# Patient Record
Sex: Female | Born: 1985
Health system: Southern US, Community
[De-identification: ages and names within clinical notes are randomized; demographics above are authoritative.]

## PROBLEM LIST (undated history)

## (undated) DIAGNOSIS — Z87442 Personal history of urinary calculi: Secondary | ICD-10-CM

## (undated) DIAGNOSIS — F419 Anxiety disorder, unspecified: Secondary | ICD-10-CM

## (undated) DIAGNOSIS — R519 Headache, unspecified: Secondary | ICD-10-CM

## (undated) DIAGNOSIS — J45909 Unspecified asthma, uncomplicated: Secondary | ICD-10-CM

## (undated) HISTORY — PX: NO PAST SURGERIES: SHX2092

---

## 2015-09-22 DIAGNOSIS — M533 Sacrococcygeal disorders, not elsewhere classified: Secondary | ICD-10-CM | POA: Diagnosis not present

## 2015-11-18 DIAGNOSIS — N760 Acute vaginitis: Secondary | ICD-10-CM | POA: Diagnosis not present

## 2015-11-18 DIAGNOSIS — F419 Anxiety disorder, unspecified: Secondary | ICD-10-CM | POA: Diagnosis not present

## 2015-12-09 DIAGNOSIS — H6121 Impacted cerumen, right ear: Secondary | ICD-10-CM | POA: Diagnosis not present

## 2015-12-11 DIAGNOSIS — H6121 Impacted cerumen, right ear: Secondary | ICD-10-CM | POA: Diagnosis not present

## 2017-02-16 DIAGNOSIS — Z23 Encounter for immunization: Secondary | ICD-10-CM | POA: Diagnosis not present

## 2017-03-20 DIAGNOSIS — Z6824 Body mass index (BMI) 24.0-24.9, adult: Secondary | ICD-10-CM | POA: Diagnosis not present

## 2017-03-20 DIAGNOSIS — N390 Urinary tract infection, site not specified: Secondary | ICD-10-CM | POA: Diagnosis not present

## 2017-04-18 DIAGNOSIS — Z0000 Encounter for general adult medical examination without abnormal findings: Secondary | ICD-10-CM | POA: Diagnosis not present

## 2017-04-21 DIAGNOSIS — R300 Dysuria: Secondary | ICD-10-CM | POA: Diagnosis not present

## 2017-04-21 DIAGNOSIS — Z0000 Encounter for general adult medical examination without abnormal findings: Secondary | ICD-10-CM | POA: Diagnosis not present

## 2017-06-05 DIAGNOSIS — M545 Low back pain: Secondary | ICD-10-CM | POA: Diagnosis not present

## 2017-06-05 DIAGNOSIS — R300 Dysuria: Secondary | ICD-10-CM | POA: Diagnosis not present

## 2017-12-04 ENCOUNTER — Other Ambulatory Visit (HOSPITAL_COMMUNITY): Payer: Self-pay | Admitting: Adult Health Nurse Practitioner

## 2017-12-04 ENCOUNTER — Ambulatory Visit (HOSPITAL_COMMUNITY)
Admission: RE | Admit: 2017-12-04 | Discharge: 2017-12-04 | Disposition: A | Discharge status: Home / Self Care | Payer: BLUE CROSS/BLUE SHIELD | Source: Ambulatory Visit | Attending: Adult Health Nurse Practitioner | Admitting: Adult Health Nurse Practitioner

## 2017-12-04 DIAGNOSIS — M79672 Pain in left foot: Secondary | ICD-10-CM | POA: Diagnosis not present

## 2017-12-04 DIAGNOSIS — Z6824 Body mass index (BMI) 24.0-24.9, adult: Secondary | ICD-10-CM | POA: Diagnosis not present

## 2017-12-04 DIAGNOSIS — M25572 Pain in left ankle and joints of left foot: Secondary | ICD-10-CM | POA: Diagnosis not present

## 2018-01-01 DIAGNOSIS — Z6824 Body mass index (BMI) 24.0-24.9, adult: Secondary | ICD-10-CM | POA: Diagnosis not present

## 2018-01-01 DIAGNOSIS — J060 Acute laryngopharyngitis: Secondary | ICD-10-CM | POA: Diagnosis not present

## 2018-01-19 DIAGNOSIS — Z6824 Body mass index (BMI) 24.0-24.9, adult: Secondary | ICD-10-CM | POA: Diagnosis not present

## 2018-01-19 DIAGNOSIS — R319 Hematuria, unspecified: Secondary | ICD-10-CM | POA: Diagnosis not present

## 2018-01-19 DIAGNOSIS — R300 Dysuria: Secondary | ICD-10-CM | POA: Diagnosis not present

## 2018-08-08 DIAGNOSIS — R532 Functional quadriplegia: Secondary | ICD-10-CM | POA: Diagnosis not present

## 2018-08-08 DIAGNOSIS — Z6824 Body mass index (BMI) 24.0-24.9, adult: Secondary | ICD-10-CM | POA: Diagnosis not present

## 2018-08-08 DIAGNOSIS — E785 Hyperlipidemia, unspecified: Secondary | ICD-10-CM | POA: Diagnosis not present

## 2018-08-13 DIAGNOSIS — E663 Overweight: Secondary | ICD-10-CM | POA: Diagnosis not present

## 2018-08-13 DIAGNOSIS — E785 Hyperlipidemia, unspecified: Secondary | ICD-10-CM | POA: Diagnosis not present

## 2018-08-13 DIAGNOSIS — Z0000 Encounter for general adult medical examination without abnormal findings: Secondary | ICD-10-CM | POA: Diagnosis not present

## 2018-08-13 DIAGNOSIS — F419 Anxiety disorder, unspecified: Secondary | ICD-10-CM | POA: Diagnosis not present

## 2018-08-13 DIAGNOSIS — E782 Mixed hyperlipidemia: Secondary | ICD-10-CM | POA: Diagnosis not present

## 2018-08-13 DIAGNOSIS — F411 Generalized anxiety disorder: Secondary | ICD-10-CM | POA: Diagnosis not present

## 2018-08-13 DIAGNOSIS — R300 Dysuria: Secondary | ICD-10-CM | POA: Diagnosis not present

## 2019-01-30 DIAGNOSIS — R358 Other polyuria: Secondary | ICD-10-CM | POA: Diagnosis not present

## 2019-01-30 DIAGNOSIS — R300 Dysuria: Secondary | ICD-10-CM | POA: Diagnosis not present

## 2019-02-18 DIAGNOSIS — M79672 Pain in left foot: Secondary | ICD-10-CM | POA: Diagnosis not present

## 2019-02-18 DIAGNOSIS — D473 Essential (hemorrhagic) thrombocythemia: Secondary | ICD-10-CM | POA: Diagnosis not present

## 2019-02-18 DIAGNOSIS — Z0000 Encounter for general adult medical examination without abnormal findings: Secondary | ICD-10-CM | POA: Diagnosis not present

## 2019-02-18 DIAGNOSIS — Z6824 Body mass index (BMI) 24.0-24.9, adult: Secondary | ICD-10-CM | POA: Diagnosis not present

## 2019-02-18 DIAGNOSIS — R300 Dysuria: Secondary | ICD-10-CM | POA: Diagnosis not present

## 2019-02-18 DIAGNOSIS — R87619 Unspecified abnormal cytological findings in specimens from cervix uteri: Secondary | ICD-10-CM | POA: Diagnosis not present

## 2019-02-18 DIAGNOSIS — F419 Anxiety disorder, unspecified: Secondary | ICD-10-CM | POA: Diagnosis not present

## 2019-02-18 DIAGNOSIS — E785 Hyperlipidemia, unspecified: Secondary | ICD-10-CM | POA: Diagnosis not present

## 2019-02-18 DIAGNOSIS — R358 Other polyuria: Secondary | ICD-10-CM | POA: Diagnosis not present

## 2019-02-18 DIAGNOSIS — Z130 Encounter for screening for diseases of the blood and blood-forming organs and certain disorders involving the immune mechanism: Secondary | ICD-10-CM | POA: Diagnosis not present

## 2019-02-18 DIAGNOSIS — M545 Low back pain: Secondary | ICD-10-CM | POA: Diagnosis not present

## 2019-02-18 DIAGNOSIS — R600 Localized edema: Secondary | ICD-10-CM | POA: Diagnosis not present

## 2019-04-05 ENCOUNTER — Other Ambulatory Visit: Payer: Self-pay

## 2019-04-05 DIAGNOSIS — Z20822 Contact with and (suspected) exposure to covid-19: Secondary | ICD-10-CM

## 2019-04-07 LAB — NOVEL CORONAVIRUS, NAA: SARS-CoV-2, NAA: NOT DETECTED

## 2019-04-08 ENCOUNTER — Telehealth: Payer: Self-pay | Admitting: *Deleted

## 2019-04-08 NOTE — Telephone Encounter (Signed)
Pt given result of COVID test; she verbalized understanding. 

## 2019-04-18 DIAGNOSIS — R0981 Nasal congestion: Secondary | ICD-10-CM | POA: Diagnosis not present

## 2019-04-18 DIAGNOSIS — R358 Other polyuria: Secondary | ICD-10-CM | POA: Diagnosis not present

## 2019-04-18 DIAGNOSIS — R509 Fever, unspecified: Secondary | ICD-10-CM | POA: Diagnosis not present

## 2019-04-18 DIAGNOSIS — J069 Acute upper respiratory infection, unspecified: Secondary | ICD-10-CM | POA: Diagnosis not present

## 2019-04-18 DIAGNOSIS — E785 Hyperlipidemia, unspecified: Secondary | ICD-10-CM | POA: Diagnosis not present

## 2019-04-18 DIAGNOSIS — R05 Cough: Secondary | ICD-10-CM | POA: Diagnosis not present

## 2019-04-18 DIAGNOSIS — F419 Anxiety disorder, unspecified: Secondary | ICD-10-CM | POA: Diagnosis not present

## 2019-04-18 DIAGNOSIS — Z0000 Encounter for general adult medical examination without abnormal findings: Secondary | ICD-10-CM | POA: Diagnosis not present

## 2019-06-03 DIAGNOSIS — R109 Unspecified abdominal pain: Secondary | ICD-10-CM | POA: Diagnosis not present

## 2019-06-03 DIAGNOSIS — R945 Abnormal results of liver function studies: Secondary | ICD-10-CM | POA: Diagnosis not present

## 2019-06-03 DIAGNOSIS — E785 Hyperlipidemia, unspecified: Secondary | ICD-10-CM | POA: Diagnosis not present

## 2019-06-03 DIAGNOSIS — F419 Anxiety disorder, unspecified: Secondary | ICD-10-CM | POA: Diagnosis not present

## 2019-06-03 DIAGNOSIS — R197 Diarrhea, unspecified: Secondary | ICD-10-CM | POA: Diagnosis not present

## 2019-06-03 DIAGNOSIS — R1010 Upper abdominal pain, unspecified: Secondary | ICD-10-CM | POA: Diagnosis not present

## 2019-06-13 IMAGING — DX DG ANKLE COMPLETE 3+V*L*
3 series · 3 of 3 positions shown · non-contrast
Comparison: None.

CLINICAL DATA: Shooting pain.  No known injury.

EXAM:
LEFT ANKLE COMPLETE - 3+ VIEW

[ankle ap]
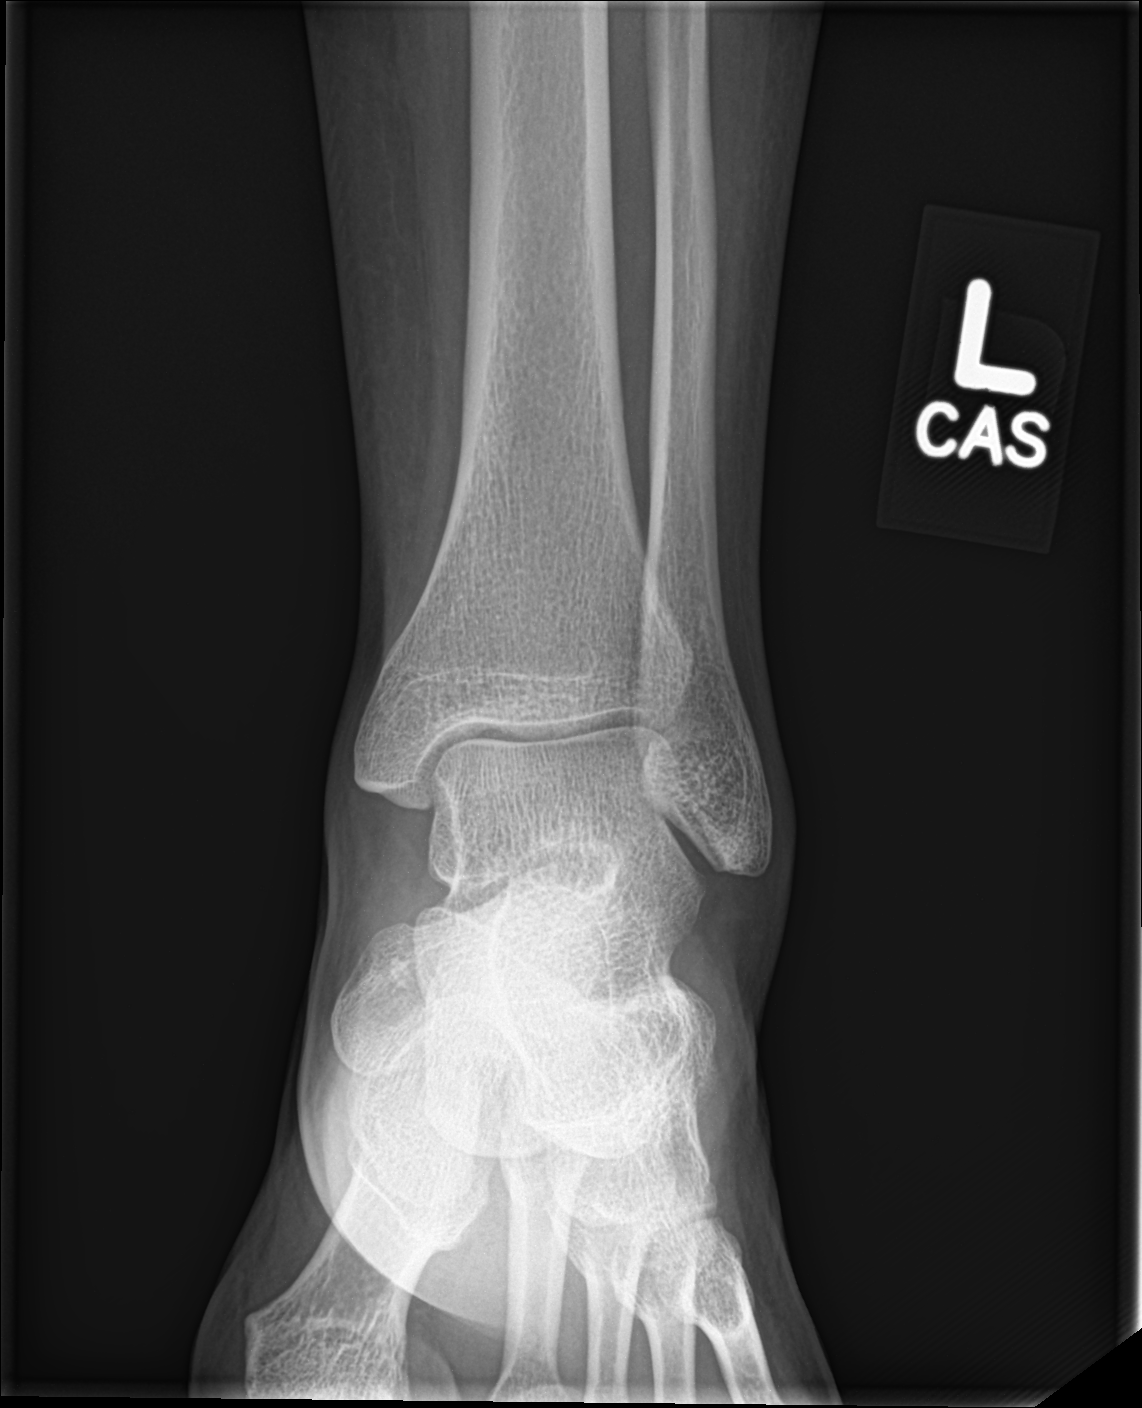

[ankle obl]
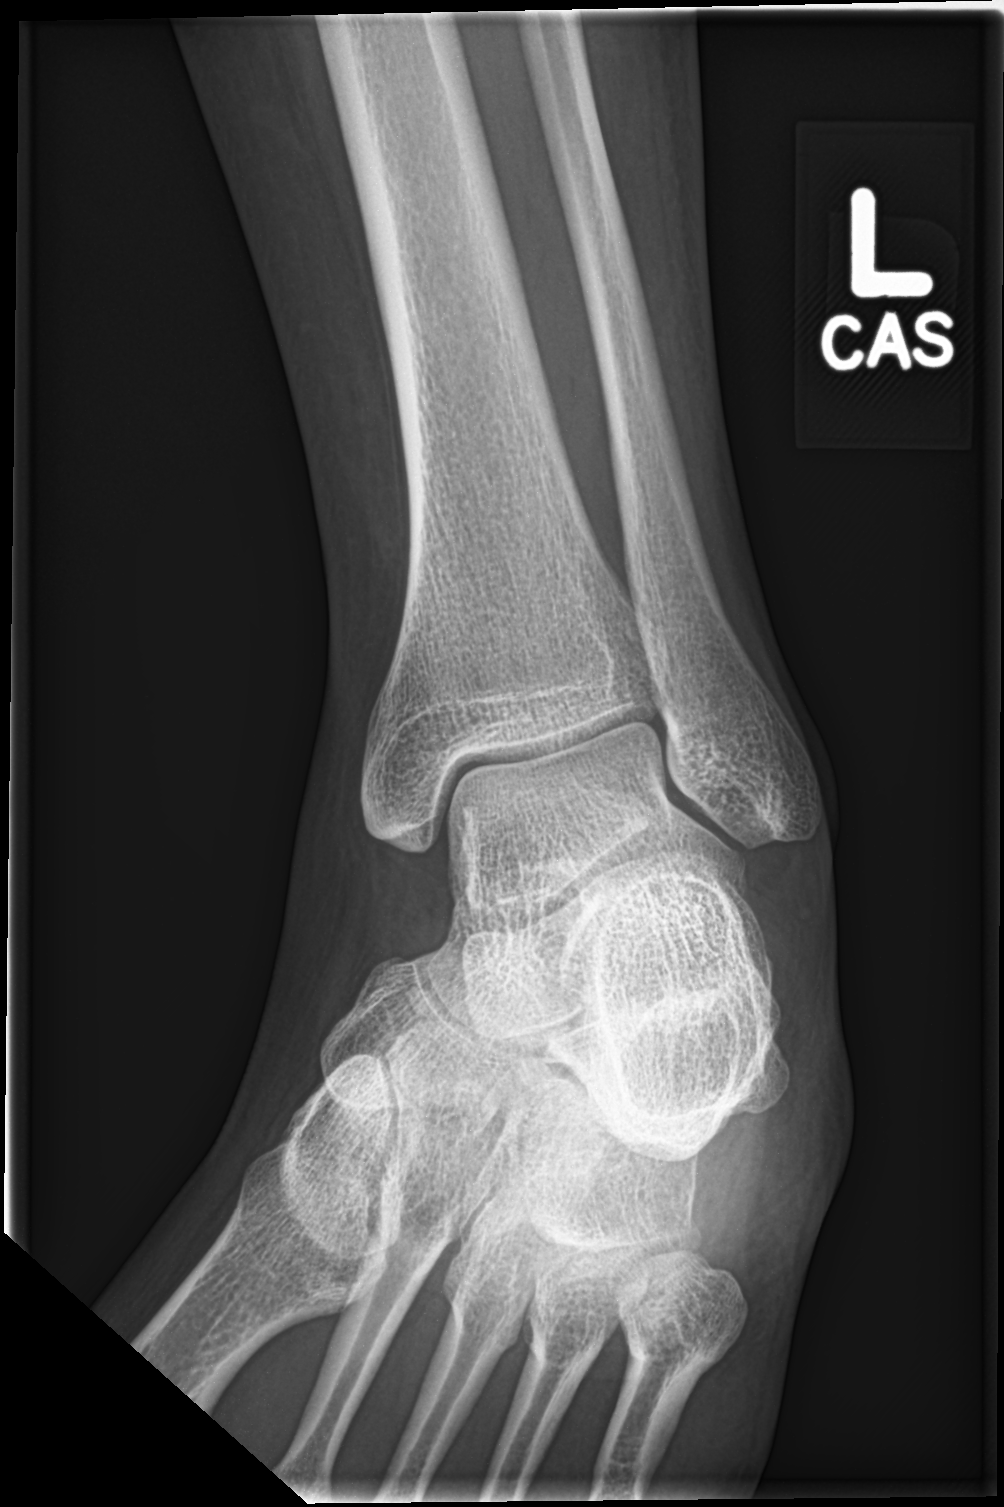

[ankle lat]
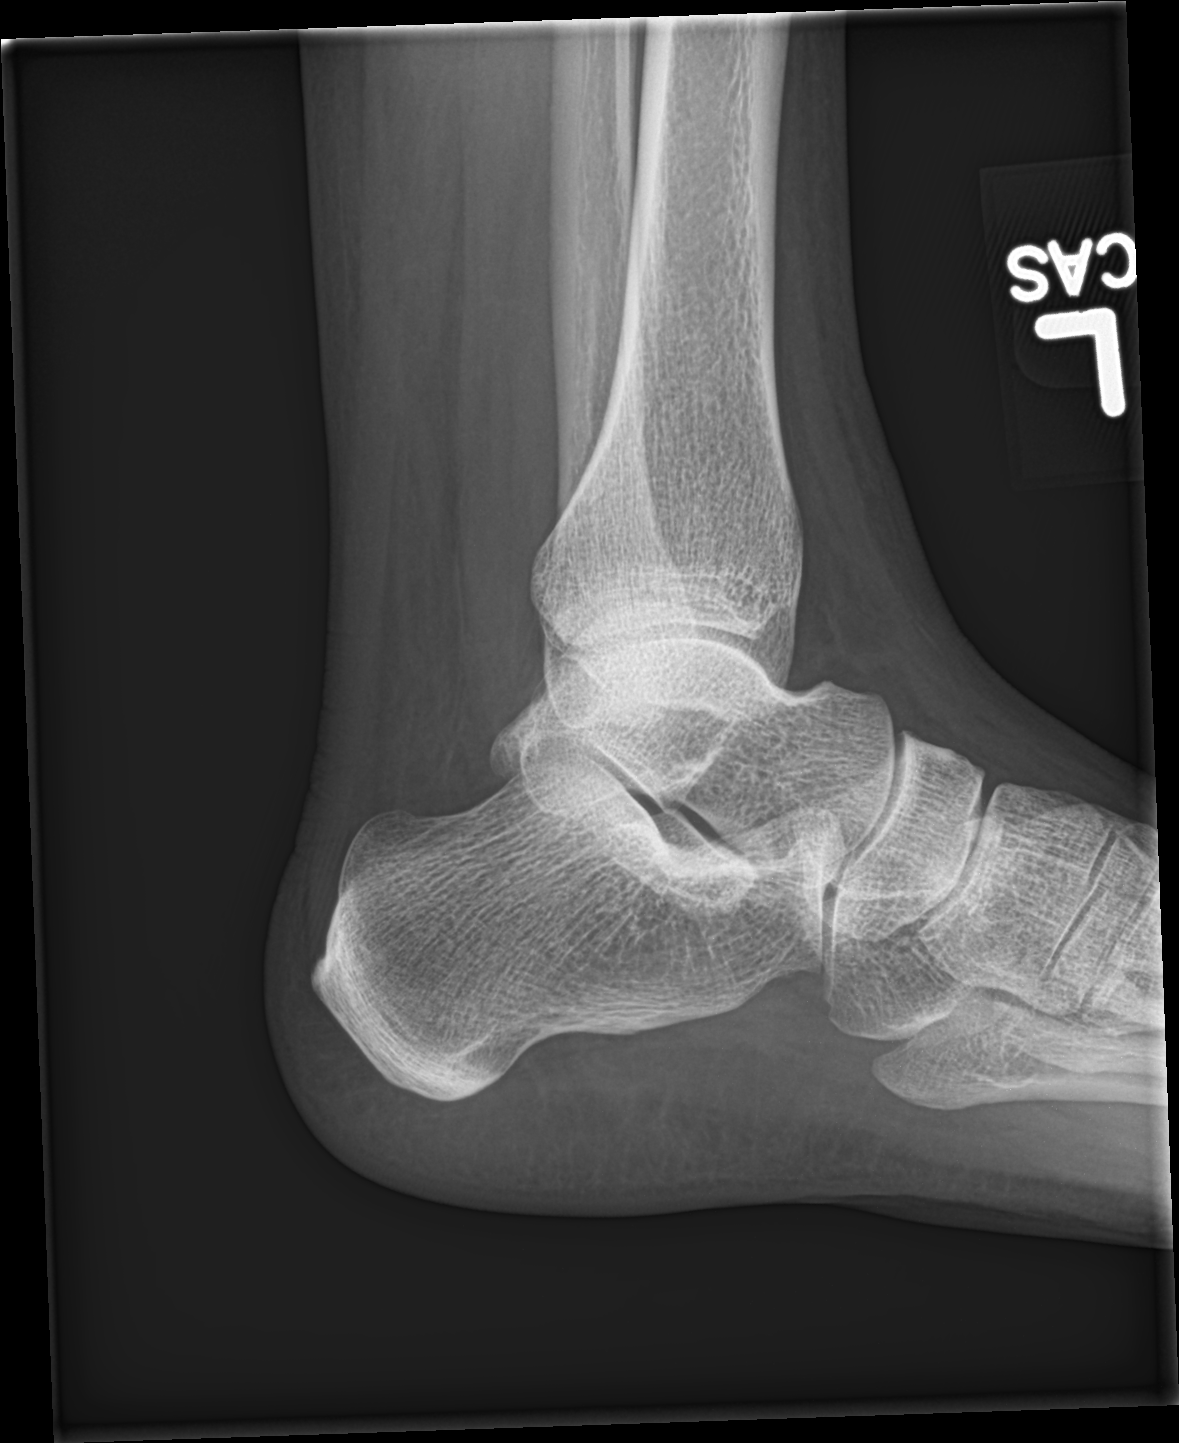

[3 of 3 positions shown; findings below may reference images not displayed]

FINDINGS: There is no evidence of fracture, dislocation, or joint effusion.
There is no evidence of arthropathy or other focal bone abnormality.
Soft tissues are unremarkable.
IMPRESSION: Negative.

## 2019-06-13 IMAGING — DX DG FOOT COMPLETE 3+V*L*
3 series · 3 of 3 positions shown · non-contrast
Comparison: None.

CLINICAL DATA: Shooting pain.  No known injury.

EXAM:
LEFT FOOT - COMPLETE 3+ VIEW

[foot ap]
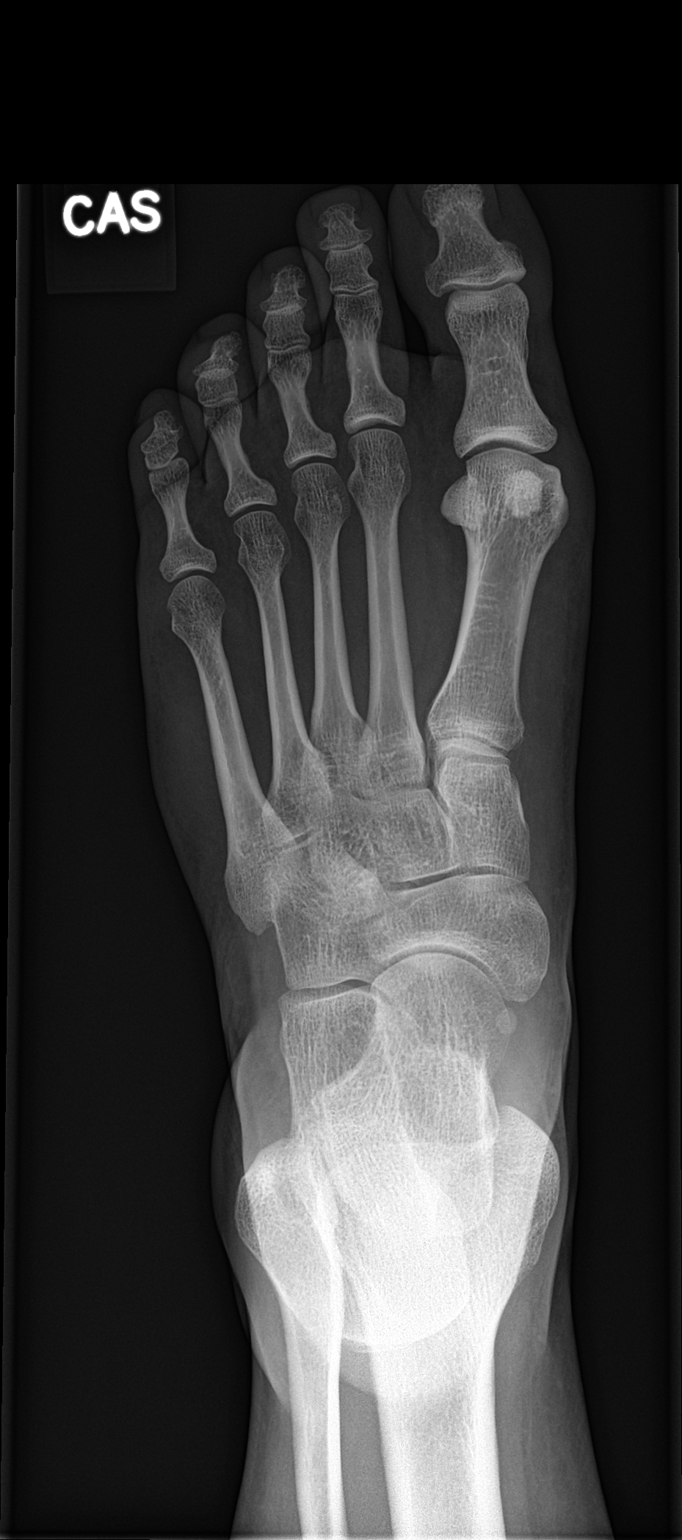

[foot obl]
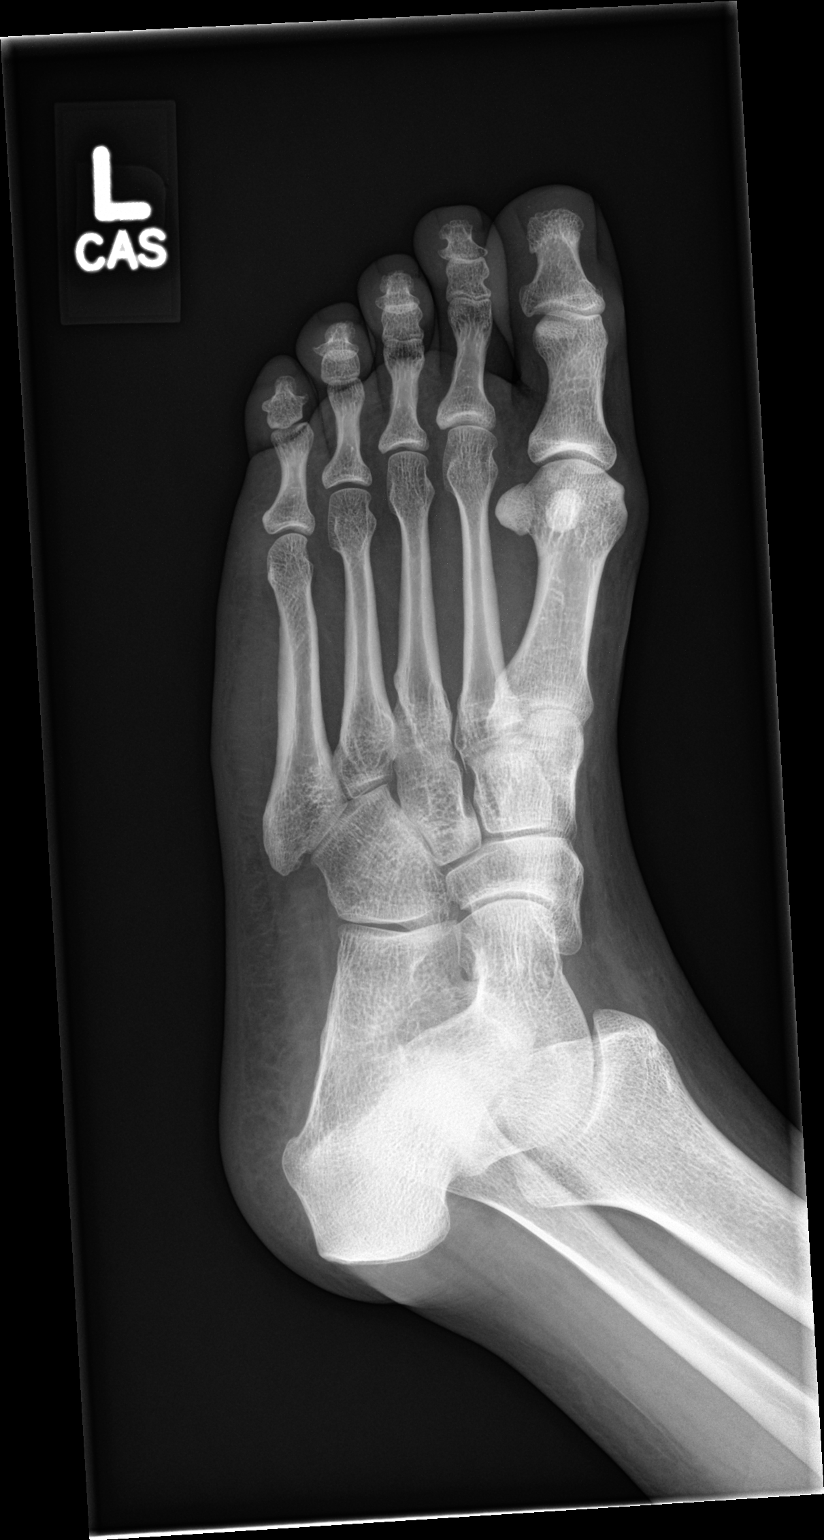

[foot lat]
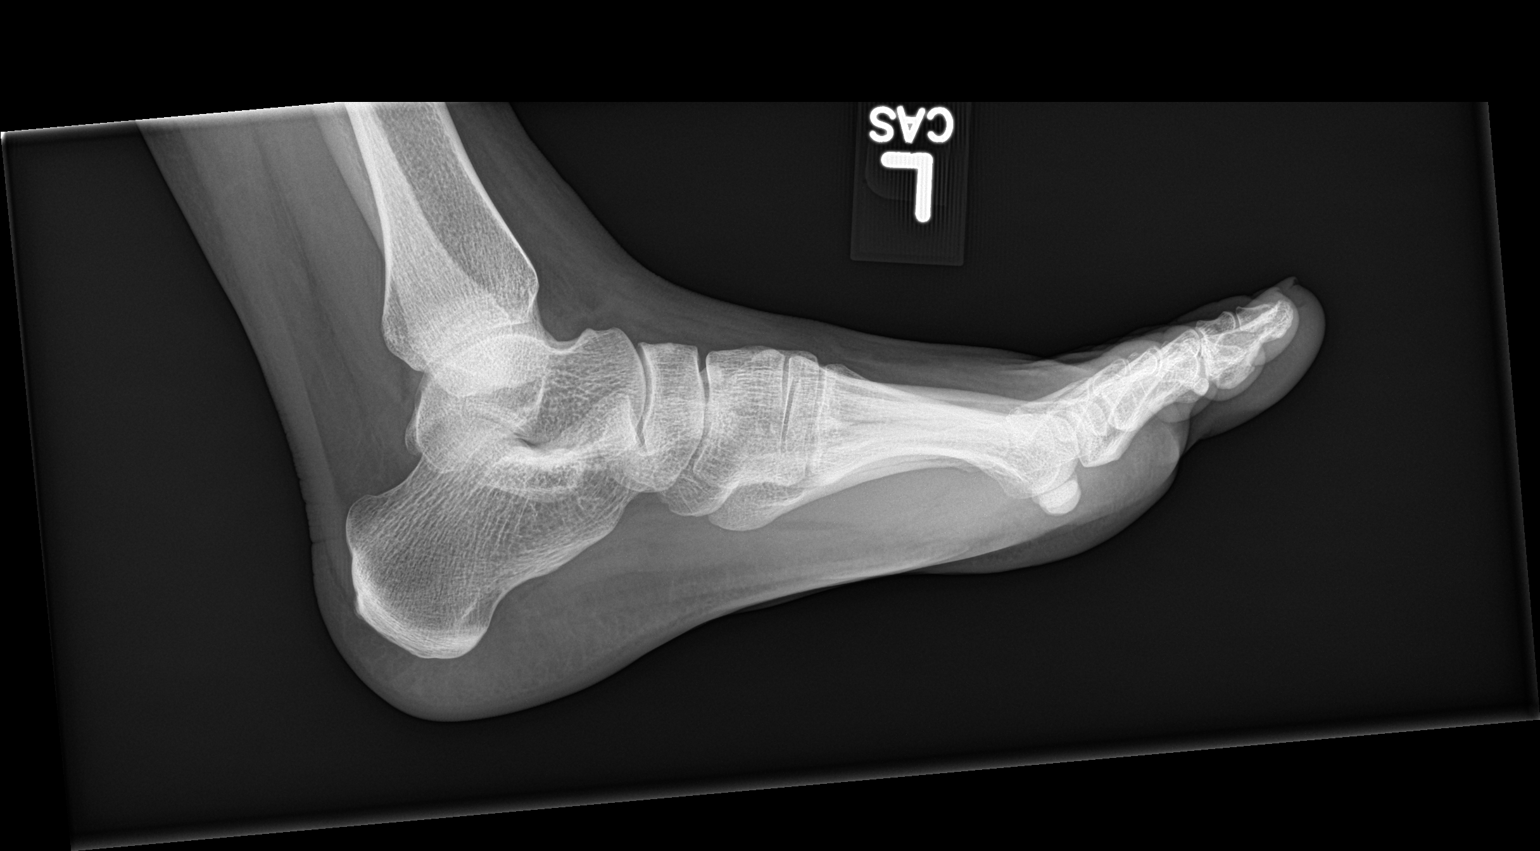

[3 of 3 positions shown; findings below may reference images not displayed]

FINDINGS: There is no evidence of fracture or dislocation. There is no
evidence of arthropathy or other focal bone abnormality. Soft
tissues are unremarkable.
IMPRESSION: Negative.

## 2020-01-22 DIAGNOSIS — R10811 Right upper quadrant abdominal tenderness: Secondary | ICD-10-CM | POA: Diagnosis not present

## 2020-01-27 ENCOUNTER — Other Ambulatory Visit: Payer: Self-pay | Admitting: Internal Medicine

## 2020-01-27 ENCOUNTER — Other Ambulatory Visit (HOSPITAL_COMMUNITY): Payer: Self-pay | Admitting: Internal Medicine

## 2020-01-27 DIAGNOSIS — R10811 Right upper quadrant abdominal tenderness: Secondary | ICD-10-CM

## 2020-02-03 ENCOUNTER — Other Ambulatory Visit: Payer: Self-pay

## 2020-02-03 ENCOUNTER — Ambulatory Visit (HOSPITAL_COMMUNITY)
Admission: RE | Admit: 2020-02-03 | Discharge: 2020-02-03 | Disposition: A | Discharge status: Home / Self Care | Payer: BLUE CROSS/BLUE SHIELD | Source: Ambulatory Visit | Attending: Internal Medicine | Admitting: Internal Medicine

## 2020-02-03 DIAGNOSIS — R10811 Right upper quadrant abdominal tenderness: Secondary | ICD-10-CM | POA: Diagnosis not present

## 2020-02-03 DIAGNOSIS — K8020 Calculus of gallbladder without cholecystitis without obstruction: Secondary | ICD-10-CM | POA: Diagnosis not present

## 2020-02-03 DIAGNOSIS — K760 Fatty (change of) liver, not elsewhere classified: Secondary | ICD-10-CM | POA: Diagnosis not present

## 2020-03-25 DIAGNOSIS — K811 Chronic cholecystitis: Secondary | ICD-10-CM | POA: Diagnosis not present

## 2020-04-01 NOTE — Progress Notes (Signed)
DUE TO COVID-19 ONLY ONE VISITOR IS ALLOWED TO COME WITH YOU AND STAY IN THE WAITING ROOM ONLY DURING PRE OP AND PROCEDURE DAY OF SURGERY. THE 1 VISITOR  MAY VISIT WITH YOU AFTER SURGERY IN YOUR PRIVATE ROOM DURING VISITING HOURS ONLY!  YOU NEED TO HAVE A COVID 19 TEST ON_11/07/2019 ______ @_______ , THIS TEST MUST BE DONE BEFORE SURGERY,  COVID TESTING SITE [address] [place] , IT IS ON THE RIGHT GOING OUT [address] APPROXIMATELY  2 MINUTES PAST [employer] ON THE RIGHT. ONCE YOUR COVID TEST IS COMPLETED,  PLEASE BEGIN THE QUARANTINE INSTRUCTIONS AS OUTLINED IN YOUR HANDOUT.                Lindsay Andrade  04/01/2020   Your procedure is scheduled on: 04/10/2020    Report to [facility] Main  Entrance   Report to admitting      0830am     Call this number if you have problems the morning of surgery [phone]    Remember: Do not eat food , candy gum or mints :After Midnight. You may have clear liquids from midnight until 0730am    CLEAR LIQUID DIET   Foods Allowed                                                                       Coffee and tea, regular and decaf                              Plain Jell-O any favor except red or purple                                            Fruit ices (not with fruit pulp)                                      Iced Popsicles                                     Carbonated beverages, regular and diet                                    Cranberry, grape and apple juices Sports drinks like Gatorade Lightly seasoned clear broth or consume(fat free) Sugar, honey syrup   _____________________________________________________________________    BRUSH YOUR TEETH MORNING OF SURGERY AND RINSE YOUR MOUTH OUT, NO CHEWING GUM CANDY OR MINTS.     Take these medicines the morning of surgery with A SIP OF WATER:    Zoloft                                You may not have any metal on your body including hair  pins and  piercings  Do not wear jewelry, make-up, lotions, powders or perfumes, deodorant             Do not wear nail polish on your fingernails.  Do not shave  48 hours prior to surgery.              Men may shave face and neck.   Do not bring valuables to the hospital. [place].  Contacts, dentures or bridgework may not be worn into surgery.  Leave suitcase in the car. After surgery it may be brought to your room.     Patients discharged the day of surgery will not be allowed to drive home. IF YOU ARE HAVING SURGERY AND GOING HOME THE SAME DAY, YOU MUST HAVE AN ADULT TO DRIVE YOU HOME AND BE WITH YOU FOR 24 HOURS. YOU MAY GO HOME BY TAXI OR UBER OR ORTHERWISE, BUT AN ADULT MUST ACCOMPANY YOU HOME AND STAY WITH YOU FOR 24 HOURS.  Name and phone number of your driver:  Special Instructions: N/A              Please read over the following fact sheets you were given: _____________________________________________________________________  [facility] - Preparing for Surgery Before surgery, you can play an important role.  Because skin is not sterile, your skin needs to be as free of germs as possible.  You can reduce the number of germs on your skin by washing with CHG (chlorahexidine gluconate) soap before surgery.  CHG is an antiseptic cleaner which kills germs and bonds with the skin to continue killing germs even after washing. Please DO NOT use if you have an allergy to CHG or antibacterial soaps.  If your skin becomes reddened/irritated stop using the CHG and inform your nurse when you arrive at Short Stay. Do not shave (including legs and underarms) for at least 48 hours prior to the first CHG shower.  You may shave your face/neck. Please follow these instructions carefully:  1.  Shower with CHG Soap the night before surgery and the  morning of Surgery.  2.  If you choose to wash your hair, wash your hair first as usual with  your  normal  shampoo.  3.  After you shampoo, rinse your hair and body thoroughly to remove the  shampoo.                           4.  Use CHG as you would any other liquid soap.  You can apply chg directly  to the skin and wash                       Gently with a scrungie or clean washcloth.  5.  Apply the CHG Soap to your body ONLY FROM THE NECK DOWN.   Do not use on face/ open                           Wound or open sores. Avoid contact with eyes, ears mouth and genitals (private parts).                       Wash face,  Genitals (private parts) with your normal soap.             6.  Wash thoroughly, paying special attention to the area where your surgery  will be performed.  7.  Thoroughly rinse your body with warm water from the neck down.  8.  DO NOT shower/wash with your normal soap after using and rinsing off  the CHG Soap.                9.  Pat yourself dry with a clean towel.            10.  Wear clean pajamas.            11.  Place clean sheets on your bed the night of your first shower and do not  sleep with pets. Day of Surgery : Do not apply any lotions/deodorants the morning of surgery.  Please wear clean clothes to the hospital/surgery center.  FAILURE TO FOLLOW THESE INSTRUCTIONS MAY RESULT IN THE CANCELLATION OF YOUR SURGERY PATIENT SIGNATURE_________________________________  NURSE SIGNATURE__________________________________  ________________________________________________________________________

## 2020-04-02 ENCOUNTER — Encounter (HOSPITAL_COMMUNITY): Payer: Self-pay

## 2020-04-02 ENCOUNTER — Other Ambulatory Visit: Payer: Self-pay

## 2020-04-02 ENCOUNTER — Encounter (HOSPITAL_COMMUNITY)
Admission: RE | Admit: 2020-04-02 | Discharge: 2020-04-02 | Disposition: A | Discharge status: Home / Self Care | Payer: BLUE CROSS/BLUE SHIELD | Source: Ambulatory Visit | Attending: Surgery | Admitting: Surgery

## 2020-04-02 DIAGNOSIS — Z01812 Encounter for preprocedural laboratory examination: Secondary | ICD-10-CM | POA: Insufficient documentation

## 2020-04-02 HISTORY — DX: Headache, unspecified: R51.9

## 2020-04-02 HISTORY — DX: Unspecified asthma, uncomplicated: J45.909

## 2020-04-02 HISTORY — DX: Anxiety disorder, unspecified: F41.9

## 2020-04-02 LAB — CBC
HCT: 36.1 % (ref 36.0–46.0)
Hemoglobin: 12.5 g/dL (ref 12.0–15.0)
MCH: 31.6 pg (ref 26.0–34.0)
MCHC: 34.6 g/dL (ref 30.0–36.0)
MCV: 91.4 fL (ref 80.0–100.0)
Platelets: 524 10*3/uL — ABNORMAL HIGH (ref 150–400)
RBC: 3.95 MIL/uL (ref 3.87–5.11)
RDW: 11.9 % (ref 11.5–15.5)
WBC: 8.7 10*3/uL (ref 4.0–10.5)
nRBC: 0 % (ref 0.0–0.2)

## 2020-04-06 ENCOUNTER — Other Ambulatory Visit (HOSPITAL_COMMUNITY): Payer: BLUE CROSS/BLUE SHIELD

## 2020-04-07 ENCOUNTER — Other Ambulatory Visit (HOSPITAL_COMMUNITY)
Admission: RE | Admit: 2020-04-07 | Discharge: 2020-04-07 | Disposition: A | Discharge status: Home / Self Care | Payer: BLUE CROSS/BLUE SHIELD | Source: Ambulatory Visit | Attending: Surgery | Admitting: Surgery

## 2020-04-07 DIAGNOSIS — Z01812 Encounter for preprocedural laboratory examination: Secondary | ICD-10-CM | POA: Diagnosis not present

## 2020-04-07 DIAGNOSIS — Z20822 Contact with and (suspected) exposure to covid-19: Secondary | ICD-10-CM | POA: Diagnosis not present

## 2020-04-07 LAB — SARS CORONAVIRUS 2 (TAT 6-24 HRS): SARS Coronavirus 2: NEGATIVE

## 2020-04-09 NOTE — H&P (Signed)
Lindsay Andrade Appointment: 03/25/2020 9:30 AM Location: [place] [facility] Patient #: [id number] DOB: 1986-06-01 Married / Language: Undefined / Race: Refused to Report/Unreported Female   History of Present Illness Lindsay Hazard B. Daphine Deutscher MD; 03/25/2020 10:04 AM) The patient is a 34 year old female who presents for evaluation of gall stones. this 34 year old white female was referred from her primary with gallstones and a history of frequent nocturnal attacks of pain the last hour to 2 hours radiating to her back and shoulder. She had a significant one when [place] was at the beach. Her ultrasound showed multiple gallstones but no evidence of acute cholecystitis.  She comes with her mother and we discussed laparoscopic cholecystectomy vitamin C in detail using the booklet. I described for complications not limited to common bile duct injury, bleeding bile leaks or bowel injury.  She is never had any abdominal surgery and has had 2 children vaginally. She's been otherwise very healthy.   Past Surgical History Lindsay Andrade, CMA; 03/25/2020 9:27 AM) No pertinent past surgical history   Diagnostic Studies History Lindsay Andrade, CMA; 03/25/2020 9:27 AM) Colonoscopy  never Mammogram  never Pap Smear  1-5 years ago  Allergies Lindsay Andrade, CMA; 03/25/2020 9:29 AM) No Known Drug Allergies  [03/25/2020]:  Medication History Lindsay Andrade, CMA; 03/25/2020 9:29 AM) No Current Medications Medications Reconciled  Social History Lindsay Andrade, CMA; 03/25/2020 9:27 AM) Caffeine use  Carbonated beverages. No alcohol use  No drug use  Tobacco use  Current some day smoker.  Family History Lindsay Andrade, CMA; 03/25/2020 9:27 AM) Alcohol Abuse  Father. Breast Cancer  Family Members In General. Cervical Cancer  Family Members In General. Colon Polyps  Family Members In General, Mother. Depression  Sister. Diabetes Mellitus  Family Members In General, Mother. Hypertension  Family  Members In General, Father. Kidney Disease  Family Members In General. Migraine Headache  Father. Ovarian Cancer  Family Members In General. Thyroid problems  Family Members In General.  Pregnancy / Birth History Lindsay Andrade, CMA; 03/25/2020 9:27 AM) Age at menarche  17 years. Gravida  2 Maternal age  72-20 Para  2 Regular periods   Other Problems Lindsay Andrade, CMA; 03/25/2020 9:27 AM) Anxiety Disorder  Asthma  Cholelithiasis  Hypercholesterolemia  Migraine Headache     Review of Systems (Lindsay Andrade CMA; 03/25/2020 9:27 AM) General Present- Fatigue. Not Present- Appetite Loss, Chills, Fever, Night Sweats, Weight Gain and Weight Loss. Skin Not Present- Change in Wart/Mole, Dryness, Hives, Jaundice, New Lesions, Non-Healing Wounds, Rash and Ulcer. HEENT Present- Seasonal Allergies and Wears glasses/contact lenses. Not Present- Earache, Hearing Loss, Hoarseness, Nose Bleed, Oral Ulcers, Ringing in the Ears, Sinus Pain, Sore Throat, Visual Disturbances and Yellow Eyes. Respiratory Not Present- Bloody sputum, Chronic Cough, Difficulty Breathing, Snoring and Wheezing. Cardiovascular Not Present- Chest Pain, Difficulty Breathing Lying Down, Leg Cramps, Palpitations, Rapid Heart Rate, Shortness of Breath and Swelling of Extremities. Gastrointestinal Present- Abdominal Pain, Bloating, Chronic diarrhea and Nausea. Not Present- Bloody Stool, Change in Bowel Habits, Constipation, Difficulty Swallowing, Excessive gas, Gets full quickly at meals, Hemorrhoids, Indigestion, Rectal Pain and Vomiting. Female Genitourinary Present- Frequency. Not Present- Nocturia, Painful Urination, Pelvic Pain and Urgency. Musculoskeletal Not Present- Back Pain, Joint Pain, Joint Stiffness, Muscle Pain, Muscle Weakness and Swelling of Extremities. Neurological Present- Headaches. Not Present- Decreased Memory, Fainting, Numbness, Seizures, Tingling, Tremor, Trouble walking and  Weakness. Psychiatric Present- Anxiety. Not Present- Bipolar, Change in Sleep Pattern, Depression, Fearful and Frequent crying. Endocrine Not Present- Cold Intolerance, Excessive Hunger, Hair Changes, Heat  Intolerance, Hot flashes and New Diabetes. Hematology Not Present- Blood Thinners, Easy Bruising, Excessive bleeding, Gland problems, HIV and Persistent Infections.  Vitals (Lindsay Andrade CMA; 03/25/2020 9:29 AM) 03/25/2020 9:28 AM Weight: 140.2 lb Height: 62in Body Surface Area: 1.64 m Body Mass Index: 25.64 kg/m  Pulse: 99 (Regular)  BP: 110/60(Sitting, Left Arm, Standard)       Physical Exam (Lindsay Fester B. Daphine Deutscher MD; 03/25/2020 10:06 AM) General Note: well-developed and maintained white female no acute distress HEENT exam unremarkable--sclerae are nonicteric Neck is supple without adenopathy Chest clear to auscultation Heart sinus rhythm without murmurs or gallops Abdomen nontender without masses or organomegaly. Extremities full range of motion Neuro alert and oriented 3. Emergency function grossly intact.     Assessment & Plan Lindsay Hazard B. Daphine Deutscher MD; 03/25/2020 10:08 AM) CHOLECYSTITIS, CHRONIC (K81.1) Impression: ultrasound revealing small gallstones and history and physical consistent with chronic cholecystitis. We discussed laparoscopic cholecystectomy with her in detail and will see about scheduling that at her convenience. All of her questions regarding the surgery have been answered but if she has more she will write those down and we will discuss loops in the holding area.  Lindsay B. Daphine Deutscher, MD, FACS

## 2020-04-10 ENCOUNTER — Ambulatory Visit (HOSPITAL_COMMUNITY): Payer: BLUE CROSS/BLUE SHIELD | Admitting: Registered Nurse

## 2020-04-10 ENCOUNTER — Ambulatory Visit (HOSPITAL_COMMUNITY): Payer: BLUE CROSS/BLUE SHIELD

## 2020-04-10 ENCOUNTER — Encounter (HOSPITAL_COMMUNITY): Payer: Self-pay | Admitting: Surgery

## 2020-04-10 ENCOUNTER — Encounter (HOSPITAL_COMMUNITY)
Admission: RE | Disposition: A | Discharge status: Home / Self Care | Payer: Self-pay | Source: Home / Self Care | Attending: Surgery

## 2020-04-10 ENCOUNTER — Ambulatory Visit (HOSPITAL_COMMUNITY)
Admission: RE | Admit: 2020-04-10 | Discharge: 2020-04-10 | Disposition: A | Discharge status: Home / Self Care | Payer: BLUE CROSS/BLUE SHIELD | Attending: Surgery | Admitting: Surgery

## 2020-04-10 DIAGNOSIS — Z9049 Acquired absence of other specified parts of digestive tract: Secondary | ICD-10-CM

## 2020-04-10 DIAGNOSIS — Z803 Family history of malignant neoplasm of breast: Secondary | ICD-10-CM | POA: Diagnosis not present

## 2020-04-10 DIAGNOSIS — F17200 Nicotine dependence, unspecified, uncomplicated: Secondary | ICD-10-CM | POA: Diagnosis not present

## 2020-04-10 DIAGNOSIS — Z8261 Family history of arthritis: Secondary | ICD-10-CM | POA: Diagnosis not present

## 2020-04-10 DIAGNOSIS — Z8049 Family history of malignant neoplasm of other genital organs: Secondary | ICD-10-CM | POA: Diagnosis not present

## 2020-04-10 DIAGNOSIS — Z841 Family history of disorders of kidney and ureter: Secondary | ICD-10-CM | POA: Diagnosis not present

## 2020-04-10 DIAGNOSIS — Z811 Family history of alcohol abuse and dependence: Secondary | ICD-10-CM | POA: Diagnosis not present

## 2020-04-10 DIAGNOSIS — Z8041 Family history of malignant neoplasm of ovary: Secondary | ICD-10-CM | POA: Diagnosis not present

## 2020-04-10 DIAGNOSIS — K915 Postcholecystectomy syndrome: Secondary | ICD-10-CM | POA: Diagnosis not present

## 2020-04-10 DIAGNOSIS — Z833 Family history of diabetes mellitus: Secondary | ICD-10-CM | POA: Diagnosis not present

## 2020-04-10 DIAGNOSIS — K8010 Calculus of gallbladder with chronic cholecystitis without obstruction: Secondary | ICD-10-CM | POA: Insufficient documentation

## 2020-04-10 DIAGNOSIS — E7800 Pure hypercholesterolemia, unspecified: Secondary | ICD-10-CM | POA: Insufficient documentation

## 2020-04-10 DIAGNOSIS — K811 Chronic cholecystitis: Secondary | ICD-10-CM | POA: Diagnosis not present

## 2020-04-10 DIAGNOSIS — J45909 Unspecified asthma, uncomplicated: Secondary | ICD-10-CM | POA: Diagnosis not present

## 2020-04-10 HISTORY — PX: CHOLECYSTECTOMY: SHX55

## 2020-04-10 LAB — PREGNANCY, URINE: Preg Test, Ur: NEGATIVE

## 2020-04-10 SURGERY — LAPAROSCOPIC CHOLECYSTECTOMY WITH INTRAOPERATIVE CHOLANGIOGRAM
Anesthesia: General | Site: Abdomen

## 2020-04-10 MED ORDER — MIDAZOLAM HCL 2 MG/2ML IJ SOLN
INTRAMUSCULAR | Status: AC
  Filled 2020-04-10: qty 2

## 2020-04-10 MED ORDER — HEPARIN SODIUM (PORCINE) 5000 UNIT/ML IJ SOLN
5000.0000 [IU] | Freq: Once | INTRAMUSCULAR | Status: AC
  Administered 2020-04-10: 5000 [IU] via SUBCUTANEOUS
  Filled 2020-04-10: qty 1

## 2020-04-10 MED ORDER — DEXAMETHASONE SODIUM PHOSPHATE 10 MG/ML IJ SOLN
INTRAMUSCULAR | Status: AC
  Filled 2020-04-10: qty 1

## 2020-04-10 MED ORDER — ONDANSETRON HCL 4 MG/2ML IJ SOLN: 4.0000 mg | Freq: Once | INTRAMUSCULAR | Status: DC | PRN

## 2020-04-10 MED ORDER — SCOPOLAMINE 1 MG/3DAYS TD PT72
1.0000 | MEDICATED_PATCH | TRANSDERMAL | Status: DC
  Administered 2020-04-10: 1.5 mg via TRANSDERMAL
  Filled 2020-04-10: qty 1

## 2020-04-10 MED ORDER — DEXAMETHASONE SODIUM PHOSPHATE 10 MG/ML IJ SOLN
INTRAMUSCULAR | Status: DC | PRN
  Administered 2020-04-10: 10 mg via INTRAVENOUS

## 2020-04-10 MED ORDER — LIDOCAINE 2% (20 MG/ML) 5 ML SYRINGE
INTRAMUSCULAR | Status: AC
  Filled 2020-04-10: qty 5

## 2020-04-10 MED ORDER — ACETAMINOPHEN 500 MG PO TABS
1000.0000 mg | ORAL_TABLET | ORAL | Status: AC
  Administered 2020-04-10: 1000 mg via ORAL
  Filled 2020-04-10: qty 2

## 2020-04-10 MED ORDER — PROPOFOL 10 MG/ML IV BOLUS
INTRAVENOUS | Status: AC
  Filled 2020-04-10: qty 20

## 2020-04-10 MED ORDER — ONDANSETRON HCL 4 MG/2ML IJ SOLN
INTRAMUSCULAR | Status: DC | PRN
  Administered 2020-04-10: 4 mg via INTRAVENOUS

## 2020-04-10 MED ORDER — BUPIVACAINE LIPOSOME 1.3 % IJ SUSP
20.0000 mL | Freq: Once | INTRAMUSCULAR | Status: DC
  Filled 2020-04-10: qty 20

## 2020-04-10 MED ORDER — CHLORHEXIDINE GLUCONATE CLOTH 2 % EX PADS: 6.0000 | MEDICATED_PAD | Freq: Once | CUTANEOUS | Status: DC

## 2020-04-10 MED ORDER — LIDOCAINE 2% (20 MG/ML) 5 ML SYRINGE
INTRAMUSCULAR | Status: DC | PRN
  Administered 2020-04-10: 60 mg via INTRAVENOUS

## 2020-04-10 MED ORDER — PROPOFOL 500 MG/50ML IV EMUL
INTRAVENOUS | Status: AC
  Filled 2020-04-10: qty 50

## 2020-04-10 MED ORDER — AMISULPRIDE (ANTIEMETIC) 5 MG/2ML IV SOLN: 10.0000 mg | Freq: Once | INTRAVENOUS | Status: DC | PRN

## 2020-04-10 MED ORDER — OXYCODONE HCL 5 MG PO TABS
5.0000 mg | ORAL_TABLET | Freq: Once | ORAL | Status: AC | PRN
  Administered 2020-04-10: 5 mg via ORAL

## 2020-04-10 MED ORDER — SUGAMMADEX SODIUM 200 MG/2ML IV SOLN
INTRAVENOUS | Status: DC | PRN
  Administered 2020-04-10: 180 mg via INTRAVENOUS

## 2020-04-10 MED ORDER — ROCURONIUM BROMIDE 10 MG/ML (PF) SYRINGE
PREFILLED_SYRINGE | INTRAVENOUS | Status: AC
  Filled 2020-04-10: qty 10

## 2020-04-10 MED ORDER — 0.9 % SODIUM CHLORIDE (POUR BTL) OPTIME
TOPICAL | Status: DC | PRN
  Administered 2020-04-10: 1000 mL

## 2020-04-10 MED ORDER — LACTATED RINGERS IR SOLN
Status: DC | PRN
  Administered 2020-04-10: 1000 mL

## 2020-04-10 MED ORDER — FENTANYL CITRATE (PF) 100 MCG/2ML IJ SOLN
INTRAMUSCULAR | Status: AC
  Filled 2020-04-10: qty 2

## 2020-04-10 MED ORDER — ORAL CARE MOUTH RINSE: 15.0000 mL | Freq: Once | OROMUCOSAL | Status: AC

## 2020-04-10 MED ORDER — ONDANSETRON HCL 4 MG/2ML IJ SOLN
INTRAMUSCULAR | Status: AC
  Filled 2020-04-10: qty 2

## 2020-04-10 MED ORDER — FENTANYL CITRATE (PF) 100 MCG/2ML IJ SOLN
25.0000 ug | INTRAMUSCULAR | Status: DC | PRN
  Administered 2020-04-10 (×3): 50 ug via INTRAVENOUS

## 2020-04-10 MED ORDER — MIDAZOLAM HCL 5 MG/5ML IJ SOLN
INTRAMUSCULAR | Status: DC | PRN
  Administered 2020-04-10: 2 mg via INTRAVENOUS

## 2020-04-10 MED ORDER — BUPIVACAINE LIPOSOME 1.3 % IJ SUSP
INTRAMUSCULAR | Status: DC | PRN
  Administered 2020-04-10: 20 mL

## 2020-04-10 MED ORDER — CHLORHEXIDINE GLUCONATE 0.12 % MT SOLN
15.0000 mL | Freq: Once | OROMUCOSAL | Status: AC
  Administered 2020-04-10: 15 mL via OROMUCOSAL

## 2020-04-10 MED ORDER — FENTANYL CITRATE (PF) 100 MCG/2ML IJ SOLN
INTRAMUSCULAR | Status: DC | PRN
  Administered 2020-04-10: 100 ug via INTRAVENOUS
  Administered 2020-04-10 (×2): 50 ug via INTRAVENOUS

## 2020-04-10 MED ORDER — CEFAZOLIN SODIUM-DEXTROSE 2-4 GM/100ML-% IV SOLN
2.0000 g | INTRAVENOUS | Status: AC
  Administered 2020-04-10: 2 g via INTRAVENOUS
  Filled 2020-04-10: qty 100

## 2020-04-10 MED ORDER — HYDROCODONE-ACETAMINOPHEN 5-325 MG PO TABS: 1.0000 | ORAL_TABLET | Freq: Four times a day (QID) | ORAL | 0 refills | Status: DC | PRN

## 2020-04-10 MED ORDER — IOHEXOL 300 MG/ML  SOLN
INTRAMUSCULAR | Status: DC | PRN
  Administered 2020-04-10: 5.5 mL

## 2020-04-10 MED ORDER — PROPOFOL 10 MG/ML IV BOLUS
INTRAVENOUS | Status: DC | PRN
  Administered 2020-04-10: 200 mg via INTRAVENOUS

## 2020-04-10 MED ORDER — OXYCODONE HCL 5 MG PO TABS
ORAL_TABLET | ORAL | Status: AC
  Filled 2020-04-10: qty 1

## 2020-04-10 MED ORDER — ROCURONIUM BROMIDE 10 MG/ML (PF) SYRINGE
PREFILLED_SYRINGE | INTRAVENOUS | Status: DC | PRN
  Administered 2020-04-10: 10 mg via INTRAVENOUS
  Administered 2020-04-10: 50 mg via INTRAVENOUS

## 2020-04-10 MED ORDER — KETOROLAC TROMETHAMINE 30 MG/ML IJ SOLN
INTRAMUSCULAR | Status: AC
  Filled 2020-04-10: qty 1

## 2020-04-10 MED ORDER — EPHEDRINE 5 MG/ML INJ
INTRAVENOUS | Status: AC
  Filled 2020-04-10: qty 10

## 2020-04-10 MED ORDER — OXYCODONE HCL 5 MG/5ML PO SOLN: 5.0000 mg | Freq: Once | ORAL | Status: AC | PRN

## 2020-04-10 MED ORDER — PHENYLEPHRINE 40 MCG/ML (10ML) SYRINGE FOR IV PUSH (FOR BLOOD PRESSURE SUPPORT)
PREFILLED_SYRINGE | INTRAVENOUS | Status: AC
  Filled 2020-04-10: qty 10

## 2020-04-10 MED ORDER — KETOROLAC TROMETHAMINE 30 MG/ML IJ SOLN
30.0000 mg | Freq: Once | INTRAMUSCULAR | Status: AC
  Administered 2020-04-10: 30 mg via INTRAVENOUS

## 2020-04-10 MED ORDER — LACTATED RINGERS IV SOLN: INTRAVENOUS | Status: DC

## 2020-04-10 SURGICAL SUPPLY — 41 items
APPLICATOR COTTON TIP 6 STRL (MISCELLANEOUS) ×1 IMPLANT
APPLICATOR COTTON TIP 6IN STRL (MISCELLANEOUS) ×2
APPLIER CLIP 5 13 M/L LIGAMAX5 (MISCELLANEOUS)
APPLIER CLIP ROT 10 11.4 M/L (STAPLE) ×2
BENZOIN TINCTURE PRP APPL 2/3 (GAUZE/BANDAGES/DRESSINGS) IMPLANT
CABLE HIGH FREQUENCY MONO STRZ (ELECTRODE) IMPLANT
CATH REDDICK CHOLANGI 4FR 50CM (CATHETERS) ×2 IMPLANT
CLIP APPLIE 5 13 M/L LIGAMAX5 (MISCELLANEOUS) IMPLANT
CLIP APPLIE ROT 10 11.4 M/L (STAPLE) ×1 IMPLANT
COVER MAYO STAND STRL (DRAPES) ×2 IMPLANT
COVER SURGICAL LIGHT HANDLE (MISCELLANEOUS) ×2 IMPLANT
COVER WAND RF STERILE (DRAPES) IMPLANT
DECANTER SPIKE VIAL GLASS SM (MISCELLANEOUS) ×2 IMPLANT
DERMABOND ADVANCED (GAUZE/BANDAGES/DRESSINGS) ×1
DERMABOND ADVANCED .7 DNX12 (GAUZE/BANDAGES/DRESSINGS) ×1 IMPLANT
DRAPE C-ARM 42X120 X-RAY (DRAPES) ×2 IMPLANT
ELECT L-HOOK LAP 45CM DISP (ELECTROSURGICAL) ×2
ELECT PENCIL ROCKER SW 15FT (MISCELLANEOUS) IMPLANT
ELECT REM PT RETURN 15FT ADLT (MISCELLANEOUS) ×2 IMPLANT
ELECTRODE L-HOOK LAP 45CM DISP (ELECTROSURGICAL) ×1 IMPLANT
GLOVE BIOGEL M 8.0 STRL (GLOVE) ×2 IMPLANT
GOWN STRL REUS W/TWL XL LVL3 (GOWN DISPOSABLE) ×2 IMPLANT
HEMOSTAT SURGICEL 4X8 (HEMOSTASIS) IMPLANT
IV CATH 14GX2 1/4 (CATHETERS) ×2 IMPLANT
KIT BASIN OR (CUSTOM PROCEDURE TRAY) ×2 IMPLANT
KIT TURNOVER KIT A (KITS) ×2 IMPLANT
NEEDLE INSUFFLATION 14GA 120MM (NEEDLE) ×2 IMPLANT
POUCH RETRIEVAL ECOSAC 10 (ENDOMECHANICALS) ×1 IMPLANT
POUCH RETRIEVAL ECOSAC 10MM (ENDOMECHANICALS) ×1
SCISSORS LAP 5X45 EPIX DISP (ENDOMECHANICALS) ×2 IMPLANT
SET IRRIG TUBING LAPAROSCOPIC (IRRIGATION / IRRIGATOR) ×2 IMPLANT
SET TUBE SMOKE EVAC HIGH FLOW (TUBING) ×2 IMPLANT
SLEEVE XCEL OPT CAN 5 100 (ENDOMECHANICALS) ×2 IMPLANT
STRIP CLOSURE SKIN 1/2X4 (GAUZE/BANDAGES/DRESSINGS) IMPLANT
SUT MNCRL AB 4-0 PS2 18 (SUTURE) ×4 IMPLANT
SYR 20ML LL LF (SYRINGE) ×2 IMPLANT
TOWEL OR 17X26 10 PK STRL BLUE (TOWEL DISPOSABLE) ×2 IMPLANT
TRAY LAPAROSCOPIC (CUSTOM PROCEDURE TRAY) ×2 IMPLANT
TROCAR BLADELESS OPT 5 100 (ENDOMECHANICALS) ×2 IMPLANT
TROCAR XCEL BLUNT TIP 100MML (ENDOMECHANICALS) IMPLANT
TROCAR XCEL NON-BLD 11X100MML (ENDOMECHANICALS) IMPLANT

## 2020-04-10 NOTE — Discharge Instructions (Signed)
General Anesthesia, Adult, Care After This sheet gives you information about how to care for yourself after your procedure. Your health care provider may also give you more specific instructions. If you have problems or questions, contact your health care provider. What can I expect after the procedure? After the procedure, the following side effects are common:  Pain or discomfort at the IV site.  Nausea.  Vomiting.  Sore throat.  Trouble concentrating.  Feeling cold or chills.  Weak or tired.  Sleepiness and fatigue.  Soreness and body aches. These side effects can affect parts of the body that were not involved in surgery. Follow these instructions at home:  For at least 24 hours after the procedure:  Have a responsible adult stay with you. It is important to have someone help care for you until you are awake and alert.  Rest as needed.  Do not: ? Participate in activities in which you could fall or become injured. ? Drive. ? Use heavy machinery. ? Drink alcohol. ? Take sleeping pills or medicines that cause drowsiness. ? Make important decisions or sign legal documents. ? Take care of children on your own. Eating and drinking  Follow any instructions from your health care provider about eating or drinking restrictions.  When you feel hungry, start by eating small amounts of foods that are soft and easy to digest (bland), such as toast. Gradually return to your regular diet.  Drink enough fluid to keep your urine pale yellow.  If you vomit, rehydrate by drinking water, juice, or clear broth. General instructions  If you have sleep apnea, surgery and certain medicines can increase your risk for breathing problems. Follow instructions from your health care provider about wearing your sleep device: ? Anytime you are sleeping, including during daytime naps. ? While taking prescription pain medicines, sleeping medicines, or medicines that make you drowsy.  Return to  your normal activities as told by your health care provider. Ask your health care provider what activities are safe for you.  Take over-the-counter and prescription medicines only as told by your health care provider.  If you smoke, do not smoke without supervision.  Keep all follow-up visits as told by your health care provider. This is important. Contact a health care provider if:  You have nausea or vomiting that does not get better with medicine.  You cannot eat or drink without vomiting.  You have pain that does not get better with medicine.  You are unable to pass urine.  You develop a skin rash.  You have a fever.  You have redness around your IV site that gets worse. Get help right away if:  You have difficulty breathing.  You have chest pain.  You have blood in your urine or stool, or you vomit blood. Summary  After the procedure, it is common to have a sore throat or nausea. It is also common to feel tired.  Have a responsible adult stay with you for the first 24 hours after general anesthesia. It is important to have someone help care for you until you are awake and alert.  When you feel hungry, start by eating small amounts of foods that are soft and easy to digest (bland), such as toast. Gradually return to your regular diet.  Drink enough fluid to keep your urine pale yellow.  Return to your normal activities as told by your health care provider. Ask your health care provider what activities are safe for you. This information is not   intended to replace advice given to you by your health care provider. Make sure you discuss any questions you have with your health care provider. Document Revised: 05/26/2017 Document Reviewed: 01/06/2017 Elsevier Patient Education  2020 [employer] Laparoscopic Cholecystectomy Laparoscopic cholecystectomy is surgery to remove the gallbladder. The gallbladder is a pear-shaped organ that lies beneath the liver on the right side  of the body. The gallbladder stores bile, which is a fluid that helps the body to digest fats. Cholecystectomy is often done for inflammation of the gallbladder (cholecystitis). This condition is usually caused by a buildup of gallstones (cholelithiasis) in the gallbladder. Gallstones can block the flow of bile, which can result in inflammation and pain. In severe cases, emergency surgery may be required. This procedure is done though small incisions in your abdomen (laparoscopic surgery). A thin scope with a camera (laparoscope) is inserted through one incision. Thin surgical instruments are inserted through the other incisions. In some cases, a laparoscopic procedure may be turned into a type of surgery that is done through a larger incision (open surgery). Tell a health care provider about:  Any allergies you have.  All medicines you are taking, including vitamins, herbs, eye drops, creams, and over-the-counter medicines.  Any problems you or family members have had with anesthetic medicines.  Any blood disorders you have.  Any surgeries you have had.  Any medical conditions you have.  Whether you are pregnant or may be pregnant. What are the risks? Generally, this is a safe procedure. However, problems may occur, including:  Infection.  Bleeding.  Allergic reactions to medicines.  Damage to other structures or organs.  A stone remaining in the common bile duct. The common bile duct carries bile from the gallbladder into the small intestine.  A bile leak from the cyst duct that is clipped when your gallbladder is removed. What happens before the procedure? Staying hydrated Follow instructions from your health care provider about hydration, which may include:  Up to 2 hours before the procedure - you may continue to drink clear liquids, such as water, clear fruit juice, black coffee, and plain tea. Eating and drinking restrictions Follow instructions from your health care  provider about eating and drinking, which may include:  8 hours before the procedure - stop eating heavy meals or foods such as meat, fried foods, or fatty foods.  6 hours before the procedure - stop eating light meals or foods, such as toast or cereal.  6 hours before the procedure - stop drinking milk or drinks that contain milk.  2 hours before the procedure - stop drinking clear liquids. Medicines  Ask your health care provider about: ? Changing or stopping your regular medicines. This is especially important if you are taking diabetes medicines or blood thinners. ? Taking medicines such as aspirin and ibuprofen. These medicines can thin your blood. Do not take these medicines before your procedure if your health care provider instructs you not to.  You may be given antibiotic medicine to help prevent infection. General instructions  Let your health care provider know if you develop a cold or an infection before surgery.  Plan to have someone take you home from the hospital or clinic.  Ask your health care provider how your surgical site will be marked or identified. What happens during the procedure?   To reduce your risk of infection: ? Your health care team will wash or sanitize their hands. ? Your skin will be washed with soap. ? Hair may  be removed from the surgical area.  An IV tube may be inserted into one of your veins.  You will be given one or more of the following: ? A medicine to help you relax (sedative). ? A medicine to make you fall asleep (general anesthetic).  A breathing tube will be placed in your mouth.  Your surgeon will make several small cuts (incisions) in your abdomen.  The laparoscope will be inserted through one of the small incisions. The camera on the laparoscope will send images to a TV screen (monitor) in the operating room. This lets your surgeon see inside your abdomen.  Air-like gas will be pumped into your abdomen. This will expand  your abdomen to give the surgeon more room to perform the surgery.  Other tools that are needed for the procedure will be inserted through the other incisions. The gallbladder will be removed through one of the incisions.  Your common bile duct may be examined. If stones are found in the common bile duct, they may be removed.  After your gallbladder has been removed, the incisions will be closed with stitches (sutures), staples, or skin glue.  Your incisions may be covered with a bandage (dressing). The procedure may vary among health care providers and hospitals. What happens after the procedure?  Your blood pressure, heart rate, breathing rate, and blood oxygen level will be monitored until the medicines you were given have worn off.  You will be given medicines as needed to control your pain.  Do not drive for 24 hours if you were given a sedative. This information is not intended to replace advice given to you by your health care provider. Make sure you discuss any questions you have with your health care provider. Document Revised: 05/05/2017 Document Reviewed: 11/09/2015 Elsevier Patient Education  2020 [employer].

## 2020-04-10 NOTE — Interval H&P Note (Signed)
History and Physical Interval Note:  04/10/2020 11:15 AM  Lindsay Andrade  has presented today for surgery, with the diagnosis of CHRONIC CHOLECYSTITIS.  The various methods of treatment have been discussed with the patient and family. After consideration of risks, benefits and other options for treatment, the patient has consented to  Procedure(s): LAPAROSCOPIC CHOLECYSTECTOMY WITH INTRAOPERATIVE CHOLANGIOGRAM (N/A) as a surgical intervention.  The patient's history has been reviewed, patient examined, no change in status, stable for surgery.  I have reviewed the patient's chart and labs.  Questions were answered to the patient's satisfaction.     Valarie Merino

## 2020-04-10 NOTE — Anesthesia Preprocedure Evaluation (Signed)
Anesthesia Evaluation  Patient identified by MRN, date of birth, ID band Patient awake    Reviewed: Allergy & Precautions, NPO status , Patient's Chart, lab work & pertinent test results  History of Anesthesia Complications Negative for: history of anesthetic complications  Airway Mallampati: II  TM Distance: >3 FB Neck ROM: Full    Dental  (+) Teeth Intact   Pulmonary neg pulmonary ROS, former smoker,    Pulmonary exam normal        Cardiovascular negative cardio ROS Normal cardiovascular exam     Neuro/Psych Anxiety negative neurological ROS     GI/Hepatic Neg liver ROS, Chronic cholecystitis   Endo/Other  negative endocrine ROS  Renal/GU negative Renal ROS  negative genitourinary   Musculoskeletal negative musculoskeletal ROS (+)   Abdominal   Peds  Hematology negative hematology ROS (+)   Anesthesia Other Findings   Reproductive/Obstetrics                            Anesthesia Physical Anesthesia Plan  ASA: II  Anesthesia Plan: General   Post-op Pain Management:    Induction: Intravenous  PONV Risk Score and Plan: 4 or greater and Ondansetron, Dexamethasone, Treatment may vary due to age or medical condition and Midazolam  Airway Management Planned: Oral ETT  Additional Equipment: None  Intra-op Plan:   Post-operative Plan: Extubation in OR  Informed Consent: I have reviewed the patients History and Physical, chart, labs and discussed the procedure including the risks, benefits and alternatives for the proposed anesthesia with the patient or authorized representative who has indicated his/her understanding and acceptance.     Dental advisory given  Plan Discussed with:   Anesthesia Plan Comments:        Anesthesia Quick Evaluation

## 2020-04-10 NOTE — Anesthesia Procedure Notes (Signed)
Procedure Name: Intubation Date/Time: 04/10/2020 11:30 AM Performed by: Victoriano Lain, CRNA Pre-anesthesia Checklist: Patient identified, Emergency Drugs available, Suction available, Patient being monitored and Timeout performed Patient Re-evaluated:Patient Re-evaluated prior to induction Oxygen Delivery Method: Circle system utilized Preoxygenation: Pre-oxygenation with 100% oxygen Induction Type: IV induction Ventilation: Mask ventilation without difficulty Laryngoscope Size: Mac and 4 Grade View: Grade I Tube type: Oral Tube size: 7.0 mm Number of attempts: 1 Airway Equipment and Method: Stylet Placement Confirmation: ETT inserted through vocal cords under direct vision,  positive ETCO2 and breath sounds checked- equal and bilateral Secured at: 21 cm Tube secured with: Tape Dental Injury: Teeth and Oropharynx as per pre-operative assessment

## 2020-04-10 NOTE — Transfer of Care (Signed)
Immediate Anesthesia Transfer of Care Note  Patient: Lindsay Andrade  Procedure(s) Performed: LAPAROSCOPIC CHOLECYSTECTOMY WITH INTRAOPERATIVE CHOLANGIOGRAM (N/A Abdomen)  Patient Location: PACU  Anesthesia Type:General  Level of Consciousness: awake, alert , oriented and patient cooperative  Airway & Oxygen Therapy: Patient Spontanous Breathing and Patient connected to face mask oxygen  Post-op Assessment: Report given to RN, Post -op Vital signs reviewed and stable and Patient moving all extremities  Post vital signs: Reviewed and stable  Last Vitals:  Vitals Value Taken Time  BP 134/92 04/10/20 1300  Temp    Pulse 96 04/10/20 1300  Resp 21 04/10/20 1300  SpO2 100 % 04/10/20 1300  Vitals shown include unvalidated device data.  Last Pain:  Vitals:   04/10/20 0837  TempSrc:   PainSc: 0-No pain         Complications: No complications documented.

## 2020-04-10 NOTE — Op Note (Signed)
Fabienne Bruns  Primary Care Physician:  Roe Rutherford, NP    04/10/2020  12:38 PM  Procedure: Laparoscopic Cholecystectomy with intraoperative cholangiogram  Surgeon: Susy Frizzle B. Daphine Deutscher, MD, FACS Asst:  Lannette Donath, MD  Anes:  General  Drains:  None  Findings: Chronic cholecystitis  Description of Procedure: The patient was taken to OR 1 and given general anesthesia.  The patient was prepped with chlorohexidine prep and draped sterilely. A time out was performed including identifying the patient and discussing their procedure.  Access to the abdomen was achieved with a Veress needle through the left upper quadrant.  Port placement included three 5 mm trocars and one 11 mm trocar in the upper midline.    The gallbladder was visualized and appeared slightly gray and thicken.   The fundus of the gallbaldder was grasped and the gallbladder was elevated. Traction on the infundibulum allowed for successful demonstration of the critical view. Inflammatory changes were minimal and chronic.  The cystic duct was identified and clipped up on the gallbladder and an incision was made in the cystic duct and the Reddick catheter was inserted after milking the cystic duct of any debris. A dynamic cholangiogram was performed which demonstrated intrahepatic filling and free flow into the duodenum without filling defects.    The cystic duct was then double clipped and divided, the cystic artery was double clipped and divided and then the gallbladder was removed from the gallbladder bed. Removal of the gallbladder from the gallbladder bed was performed without entering it.  The gallbladder was then placed in a bag and brought out through one of the trocar sites. The gallbladder bed was inspected and no bleeding or bile leaks were seen.   Laparoscopic visualization was used when closing fascial defects for trocar sites.  Survey of abdomen done and everything appeared stable and as it should.   Incisions were  injected with Exparel and closed with 4-0 Monocryl and Dermabond on the skin.  Sponge and needle count were correct.    The patient was taken to the recovery room in satisfactory condition.

## 2020-04-13 ENCOUNTER — Encounter (HOSPITAL_COMMUNITY): Payer: Self-pay | Admitting: Surgery

## 2020-04-13 LAB — SURGICAL PATHOLOGY

## 2020-04-13 NOTE — Anesthesia Postprocedure Evaluation (Signed)
Anesthesia Post Note  Patient: Lindsay Andrade  Procedure(s) Performed: LAPAROSCOPIC CHOLECYSTECTOMY WITH INTRAOPERATIVE CHOLANGIOGRAM (N/A Abdomen)     Patient location during evaluation: PACU Anesthesia Type: General Level of consciousness: awake and alert Pain management: pain level controlled Vital Signs Assessment: post-procedure vital signs reviewed and stable Respiratory status: spontaneous breathing, nonlabored ventilation and respiratory function stable Cardiovascular status: blood pressure returned to baseline and stable Postop Assessment: no apparent nausea or vomiting Anesthetic complications: no   No complications documented.  Last Vitals:  Vitals:   04/10/20 1415 04/10/20 1436  BP: 122/86 116/61  Pulse: 69 (!) 57  Resp: 17 16  Temp:    SpO2: 99% 100%    Last Pain:  Vitals:   04/10/20 1436  TempSrc:   PainSc: 2                  Lucretia Kern

## 2020-06-08 DIAGNOSIS — R519 Headache, unspecified: Secondary | ICD-10-CM | POA: Diagnosis not present

## 2020-06-08 DIAGNOSIS — R5081 Fever presenting with conditions classified elsewhere: Secondary | ICD-10-CM | POA: Diagnosis not present

## 2020-06-08 DIAGNOSIS — R000 Tachycardia, unspecified: Secondary | ICD-10-CM | POA: Diagnosis not present

## 2020-06-08 DIAGNOSIS — J029 Acute pharyngitis, unspecified: Secondary | ICD-10-CM | POA: Diagnosis not present

## 2020-06-12 DIAGNOSIS — U071 COVID-19: Secondary | ICD-10-CM | POA: Diagnosis not present

## 2020-06-12 DIAGNOSIS — R0602 Shortness of breath: Secondary | ICD-10-CM | POA: Diagnosis not present

## 2020-06-12 DIAGNOSIS — R059 Cough, unspecified: Secondary | ICD-10-CM | POA: Diagnosis not present

## 2022-01-24 ENCOUNTER — Ambulatory Visit (INDEPENDENT_AMBULATORY_CARE_PROVIDER_SITE_OTHER): Payer: 59 | Admitting: Urology

## 2022-01-24 ENCOUNTER — Encounter: Payer: Self-pay | Admitting: Urology

## 2022-01-24 VITALS — BP 114/67 | HR 91

## 2022-01-24 DIAGNOSIS — N201 Calculus of ureter: Secondary | ICD-10-CM | POA: Diagnosis not present

## 2022-01-24 DIAGNOSIS — N200 Calculus of kidney: Secondary | ICD-10-CM

## 2022-01-24 LAB — URINALYSIS, ROUTINE W REFLEX MICROSCOPIC
Bilirubin, UA: NEGATIVE
Glucose, UA: NEGATIVE
Ketones, UA: NEGATIVE
Nitrite, UA: NEGATIVE
Protein,UA: NEGATIVE
Specific Gravity, UA: <1.005 — ABNORMAL LOW (ref 1.005–1.030)
Urobilinogen, Ur: 0.2 mg/dL (ref 0.2–1.0)
pH, UA: 6 (ref 5.0–7.5)

## 2022-01-24 LAB — MICROSCOPIC EXAMINATION: Renal Epithel, UA: NONE SEEN /hpf

## 2022-01-24 NOTE — Progress Notes (Unsigned)
01/24/2022 1:14 PM   Lindsay Andrade [birth year] [id number]  Referring provider: Roe Rutherford, NP [address] [place],  [place] [address]  No chief complaint on file.   HPI:  New patient-  1) renal and ureteral stone-patient developed left flank pain underwent CT scan January 11, 2022 which revealed a 6 mm right lower pole stone (visible) and a 3 mm right upper pole stone, there was also a 3 mm left proximal stone and hydronephrosis.  Her white count was 13 and creatinine 0.7.  She continued to have pain underwent repeat CT scan 01/16/2022-stable right stones and now the left ureteral stone was at the left UVJ.  Her white count was 19, creatinine 0.8 and urine culture negative.  She hasn't seen a stone pass but pain improved. She took tamsulosin. No prior stones. Drinks Dr Demetrius Charity zero.   She does billing for transportation services.   PMH: Past Medical History:  Diagnosis Date   Anxiety    Asthma    hx of    Headache     Surgical History: Past Surgical History:  Procedure Laterality Date   CHOLECYSTECTOMY N/A 04/10/2020   Procedure: LAPAROSCOPIC CHOLECYSTECTOMY WITH INTRAOPERATIVE CHOLANGIOGRAM;  Surgeon: Luretha Murphy, MD;  Location: [facility];  Service: General;  Laterality: N/A;   NO PAST SURGERIES      Home Medications:  Allergies as of 01/24/2022   No Known Allergies      Medication List        Accurate as of January 24, 2022  1:14 PM. If you have any questions, ask your nurse or doctor.          acetaminophen 500 MG tablet Commonly known as: TYLENOL Take 2,000 mg by mouth 2 (two) times daily as needed for moderate pain or headache.   ciprofloxacin 500 MG tablet Commonly known as: CIPRO Take 500 mg by mouth 2 (two) times daily.   HYDROcodone-acetaminophen 5-325 MG tablet Commonly known as: NORCO/VICODIN Take 1 tablet by mouth every 6 (six) hours as needed for moderate pain.   ibuprofen 200 MG tablet Commonly known as: ADVIL Take 800 mg by  mouth every 6 (six) hours as needed for headache or moderate pain.   promethazine 25 MG tablet Commonly known as: PHENERGAN Take 25 mg by mouth every 6 (six) hours as needed.   sertraline 25 MG tablet Commonly known as: ZOLOFT Take 25 mg by mouth daily.   tamsulosin 0.4 MG Caps capsule Commonly known as: FLOMAX Take 0.4 mg by mouth at bedtime.        Allergies: No Known Allergies  Family History: No family history on file.  Social History:  reports that she has quit smoking. She has never used smokeless tobacco. She reports that she does not drink alcohol and does not use drugs.   Physical Exam: BP 114/67   Pulse 91   Constitutional:  Alert and oriented, No acute distress. HEENT:  AT, moist mucus membranes.  Trachea midline, no masses. Cardiovascular: No clubbing, cyanosis, or edema. Respiratory: Normal respiratory effort, no increased work of breathing. GI: Abdomen is soft, nontender, nondistended, no abdominal masses GU: No CVA tenderness Skin: No rashes, bruises or suspicious lesions. Neurologic: Grossly intact, no focal deficits, moving all 4 extremities. Psychiatric: Normal mood and affect.  Laboratory Data: Lab Results  Component Value Date   WBC 8.7 04/02/2020   HGB 12.5 04/02/2020   HCT 36.1 04/02/2020   MCV 91.4 04/02/2020   PLT 524 (H)  04/02/2020    No results found for: "CREATININE"  No results found for: "PSA"  No results found for: "TESTOSTERONE"  No results found for: "HGBA1C"  Urinalysis No results found for: "COLORURINE", "APPEARANCEUR", "LABSPEC", "PHURINE", "GLUCOSEU", "HGBUR", "BILIRUBINUR", "KETONESUR", "PROTEINUR", "UROBILINOGEN", "NITRITE", "LEUKOCYTESUR"  No results found for: "LABMICR", "WBCUA", "RBCUA", "LABEPIT", "MUCUS", "BACTERIA"  Pertinent Imaging: CT x 2 on canopy, labs on CE    Assessment & Plan:    1. Kidney stones - disc nature r/b of surv, eswl and urs. Check KUB. Disc diet changes to prevent stones.   -  Urinalysis, Routine w reflex microscopic  2. Ureteral stone - likely passed. Check renal US   No follow-ups on file.  Jerilee Field, MD  [facility]  [address] [place], [place] [address] [phone]

## 2022-01-26 LAB — URINE CULTURE: Organism ID, Bacteria: NO GROWTH

## 2022-01-31 ENCOUNTER — Ambulatory Visit (HOSPITAL_COMMUNITY)
Admission: RE | Admit: 2022-01-31 | Discharge: 2022-01-31 | Disposition: A | Discharge status: Home / Self Care | Payer: 59 | Source: Ambulatory Visit | Attending: Urology | Admitting: Urology

## 2022-01-31 DIAGNOSIS — N200 Calculus of kidney: Secondary | ICD-10-CM | POA: Insufficient documentation

## 2022-03-07 ENCOUNTER — Ambulatory Visit: Payer: 59 | Admitting: Urology

## 2022-03-21 ENCOUNTER — Ambulatory Visit: Payer: 59 | Admitting: Urology

## 2022-08-19 DIAGNOSIS — Z139 Encounter for screening, unspecified: Secondary | ICD-10-CM | POA: Diagnosis not present

## 2022-08-25 DIAGNOSIS — Z124 Encounter for screening for malignant neoplasm of cervix: Secondary | ICD-10-CM | POA: Diagnosis not present

## 2022-08-25 DIAGNOSIS — R109 Unspecified abdominal pain: Secondary | ICD-10-CM | POA: Diagnosis not present

## 2022-08-25 DIAGNOSIS — R1030 Lower abdominal pain, unspecified: Secondary | ICD-10-CM | POA: Diagnosis not present

## 2022-08-25 DIAGNOSIS — F418 Other specified anxiety disorders: Secondary | ICD-10-CM | POA: Diagnosis not present

## 2022-08-25 DIAGNOSIS — R7989 Other specified abnormal findings of blood chemistry: Secondary | ICD-10-CM | POA: Diagnosis not present

## 2022-08-25 DIAGNOSIS — K219 Gastro-esophageal reflux disease without esophagitis: Secondary | ICD-10-CM | POA: Diagnosis not present

## 2022-09-09 ENCOUNTER — Encounter: Payer: Self-pay | Admitting: Advanced Practice Midwife

## 2022-09-09 ENCOUNTER — Ambulatory Visit: Payer: BC Managed Care – PPO | Admitting: Advanced Practice Midwife

## 2022-09-09 VITALS — BP 109/74 | HR 70 | Ht 62.0 in | Wt 151.0 lb

## 2022-09-09 DIAGNOSIS — Z30433 Encounter for removal and reinsertion of intrauterine contraceptive device: Secondary | ICD-10-CM | POA: Diagnosis not present

## 2022-09-09 DIAGNOSIS — Z3202 Encounter for pregnancy test, result negative: Secondary | ICD-10-CM | POA: Diagnosis not present

## 2022-09-09 DIAGNOSIS — Z30430 Encounter for insertion of intrauterine contraceptive device: Secondary | ICD-10-CM | POA: Insufficient documentation

## 2022-09-09 LAB — POCT URINE PREGNANCY: Preg Test, Ur: NEGATIVE

## 2022-09-09 MED ORDER — PARAGARD INTRAUTERINE COPPER IU IUD
1.0000 | INTRAUTERINE_SYSTEM | Freq: Once | INTRAUTERINE | Status: AC
  Administered 2022-09-09: 1 via INTRAUTERINE

## 2022-09-09 NOTE — Patient Instructions (Signed)
Nothing in vagina for 3 days (no sex, douching, tampons, etc...) °Check your strings once a month to make sure you can feel them, if you are not able to please let us know °If you develop a fever of 100.4 or more in the next few weeks, or if you develop severe abdominal pain, please let us know ° °

## 2022-09-09 NOTE — Addendum Note (Signed)
Addended by: Federico Flake A on: 09/09/2022 11:42 AM   Modules accepted: Orders

## 2022-09-09 NOTE — Progress Notes (Signed)
IUD REMOVAL & RE-INSERTION Patient name: Lindsay Andrade MRN [id number]  Date of birth: 03-25-1986 Subjective Findings:   @Lindsay  Andrade is a 37 y.o. G2P2 Caucasian female being seen today for removal of a Paragard  IUD and insertion of a Paragard  IUD. Her IUD was placed March 2014.  Patient's last menstrual period was 08/25/2022. Last sexual intercourse was (didn't ask) Last pap Feb 2024. Results were:  neg per pt report  The risks and benefits of the method and placement have been thouroughly reviewed with the patient and all questions were answered.  Specifically the patient is aware of failure rate of 06/998, expulsion of the IUD and of possible perforation.  The patient is aware of irregular bleeding due to the method and understands the incidence of irregular bleeding diminishes with time.  Signed copy of informed consent in chart.       No data to display               No data to display           Pertinent History Reviewed:   Reviewed past medical,surgical, social, obstetrical and family history.  Reviewed problem list, medications and allergies. Objective Findings & Procedure:    Vitals:   09/09/22 1049  BP: 109/74  Pulse: 70  Weight: 151 lb (68.5 kg)  Height: 5\' 2"  (1.575 m)  Body mass index is 27.62 kg/m.  Results for orders placed or performed in visit on 09/09/22 (from the past 24 hour(s))  POCT urine pregnancy   Collection Time: 09/09/22 10:59 AM  Result Value Ref Range   Preg Test, Ur Negative Negative     Time out was performed.  A Graves speculum was placed in the vagina.  The cervix was visualized, prepped using Betadine. The strings were visible. They were grasped and the Paragard IUD was easily removed. The cervix was then grasped with a single-tooth tenaculum. The uterus was found to be neutral and it sounded to 7 cm.  Paragard  IUD placed per manufacturer's recommendations without complications. The strings were trimmed to approximately 3 cm.  The  patient tolerated the procedure well.    Chaperone: Field seismologist & Plan:   1) Paragard IUD removal & Paragard insertion The patient was given post procedure instructions, including signs and symptoms of infection and to check for the strings after each menses or each month, and refraining from intercourse or anything in the vagina for 3 days. She was given a care card with date IUD placed, and date IUD to be removed. She is scheduled for a f/u appointment in 4 weeks.  Orders Placed This Encounter  Procedures   POCT urine pregnancy    Follow-up: Return in about 4 weeks (around 10/07/2022) for IUD f/u.  Arabella Merles CNM 09/09/2022 11:25 AM

## 2022-09-22 DIAGNOSIS — R1031 Right lower quadrant pain: Secondary | ICD-10-CM | POA: Diagnosis not present

## 2022-09-22 DIAGNOSIS — Z79899 Other long term (current) drug therapy: Secondary | ICD-10-CM | POA: Diagnosis not present

## 2022-09-22 DIAGNOSIS — F418 Other specified anxiety disorders: Secondary | ICD-10-CM | POA: Diagnosis not present

## 2022-09-23 ENCOUNTER — Other Ambulatory Visit (HOSPITAL_COMMUNITY): Payer: Self-pay | Admitting: Family Medicine

## 2022-09-23 DIAGNOSIS — R1031 Right lower quadrant pain: Secondary | ICD-10-CM

## 2022-10-10 ENCOUNTER — Encounter (HOSPITAL_COMMUNITY): Payer: Self-pay

## 2022-10-10 ENCOUNTER — Ambulatory Visit: Payer: 59 | Admitting: Women's Health

## 2022-10-10 ENCOUNTER — Ambulatory Visit (HOSPITAL_COMMUNITY): Admission: RE | Admit: 2022-10-10 | Payer: BC Managed Care – PPO | Source: Ambulatory Visit

## 2022-11-13 ENCOUNTER — Ambulatory Visit (HOSPITAL_BASED_OUTPATIENT_CLINIC_OR_DEPARTMENT_OTHER)
Admission: RE | Admit: 2022-11-13 | Discharge: 2022-11-13 | Disposition: A | Discharge status: Home / Self Care | Payer: BC Managed Care – PPO | Source: Ambulatory Visit | Attending: Family Medicine | Admitting: Family Medicine

## 2022-11-13 DIAGNOSIS — R1031 Right lower quadrant pain: Secondary | ICD-10-CM | POA: Diagnosis not present

## 2022-11-13 DIAGNOSIS — R109 Unspecified abdominal pain: Secondary | ICD-10-CM | POA: Diagnosis not present

## 2022-11-13 DIAGNOSIS — N1330 Unspecified hydronephrosis: Secondary | ICD-10-CM | POA: Diagnosis not present

## 2022-11-13 MED ORDER — IOHEXOL 300 MG/ML  SOLN
100.0000 mL | Freq: Once | INTRAMUSCULAR | Status: AC | PRN
  Administered 2022-11-13: 80 mL via INTRAVENOUS

## 2022-11-25 ENCOUNTER — Telehealth: Payer: Self-pay

## 2022-11-25 ENCOUNTER — Other Ambulatory Visit: Payer: Self-pay

## 2022-11-25 DIAGNOSIS — N200 Calculus of kidney: Secondary | ICD-10-CM

## 2022-11-25 NOTE — Telephone Encounter (Signed)
I contacted patient this morning for an KUB that was ordered today.  She will go in today at [facility], and it get done today, confirmed by patient.

## 2022-11-28 ENCOUNTER — Encounter: Payer: Self-pay | Admitting: Urology

## 2022-11-28 ENCOUNTER — Ambulatory Visit (HOSPITAL_COMMUNITY)
Admission: RE | Admit: 2022-11-28 | Discharge: 2022-11-28 | Disposition: A | Discharge status: Home / Self Care | Payer: BC Managed Care – PPO | Source: Ambulatory Visit | Attending: Urology | Admitting: Urology

## 2022-11-28 ENCOUNTER — Ambulatory Visit: Payer: BC Managed Care – PPO | Admitting: Urology

## 2022-11-28 VITALS — BP 130/86 | HR 89

## 2022-11-28 DIAGNOSIS — N200 Calculus of kidney: Secondary | ICD-10-CM | POA: Diagnosis not present

## 2022-11-28 DIAGNOSIS — N2889 Other specified disorders of kidney and ureter: Secondary | ICD-10-CM | POA: Diagnosis not present

## 2022-11-28 LAB — URINALYSIS, ROUTINE W REFLEX MICROSCOPIC
Bilirubin, UA: NEGATIVE
Glucose, UA: NEGATIVE
Ketones, UA: NEGATIVE
Nitrite, UA: NEGATIVE
Protein,UA: NEGATIVE
Specific Gravity, UA: 1.03 (ref 1.005–1.030)
Urobilinogen, Ur: 0.2 mg/dL (ref 0.2–1.0)
pH, UA: 5.5 (ref 5.0–7.5)

## 2022-11-28 LAB — MICROSCOPIC EXAMINATION

## 2022-11-28 MED ORDER — OXYCODONE-ACETAMINOPHEN 5-325 MG PO TABS: 1.0000 | ORAL_TABLET | ORAL | 0 refills | Status: DC | PRN

## 2022-11-28 MED ORDER — ONDANSETRON HCL 4 MG PO TABS: 4.0000 mg | ORAL_TABLET | Freq: Three times a day (TID) | ORAL | 0 refills | Status: DC | PRN

## 2022-11-28 NOTE — Patient Instructions (Signed)

## 2022-11-28 NOTE — Progress Notes (Signed)
11/28/2022 8:56 AM   Lindsay Andrade 06/02/1986 [id number]  Referring provider: Lupita Raider, NP [address]urey [address] [place],  [place] [address]  No chief complaint on file.   HPI: [name] is a 37yo here for evaluation of nephrolithiasis. She was seen in the ER on 6/9 and diagnosed 4mm right UPJ calculus and 8mm right lower pole calculus. This is her third stone event. She mild right flank pain that is intermittent. She denies nausea/vomiting. No worsening LUTS. KUb from today shows right distal ureteral calculus and right lower pole calculus.    PMH: Past Medical History:  Diagnosis Date   Anxiety    Asthma    hx of    Headache     Surgical History: Past Surgical History:  Procedure Laterality Date   CHOLECYSTECTOMY N/A 04/10/2020   Procedure: LAPAROSCOPIC CHOLECYSTECTOMY WITH INTRAOPERATIVE CHOLANGIOGRAM;  Surgeon: Lindsay Murphy, MD;  Location: [facility];  Service: General;  Laterality: N/A;   NO PAST SURGERIES      Home Medications:  Allergies as of 11/28/2022   No Known Allergies      Medication List        Accurate as of November 28, 2022  8:56 AM. If you have any questions, ask your nurse or doctor.          buPROPion 300 MG 24 hr tablet Commonly known as: WELLBUTRIN XL Take 300 mg by mouth daily.   ibuprofen 200 MG tablet Commonly known as: ADVIL Take 800 mg by mouth every 6 (six) hours as needed for headache or moderate pain.   pantoprazole 40 MG tablet Commonly known as: PROTONIX Take 40 mg by mouth daily.   sertraline 25 MG tablet Commonly known as: ZOLOFT Take 25 mg by mouth daily.   tamsulosin 0.4 MG Caps capsule Commonly known as: FLOMAX Take 0.4 mg by mouth at bedtime.        Allergies: No Known Allergies  Family History: No family history on file.  Social History:  reports that she has quit smoking. She has never used smokeless tobacco. She reports that she does not drink alcohol and does not use drugs.  ROS: All other review of  systems were reviewed and are negative except what is noted above in HPI  Physical Exam: BP 130/86   Pulse 89   Constitutional:  Alert and oriented, No acute distress. HEENT: [place] AT, moist mucus membranes.  Trachea midline, no masses. Cardiovascular: No clubbing, cyanosis, or edema. Respiratory: Normal respiratory effort, no increased work of breathing. GI: Abdomen is soft, nontender, nondistended, no abdominal masses GU: No CVA tenderness.  Lymph: No cervical or inguinal lymphadenopathy. Skin: No rashes, bruises or suspicious lesions. Neurologic: Grossly intact, no focal deficits, moving all 4 extremities. Psychiatric: Normal mood and affect.  Laboratory Data: Lab Results  Component Value Date   WBC 8.7 04/02/2020   HGB 12.5 04/02/2020   HCT 36.1 04/02/2020   MCV 91.4 04/02/2020   PLT 524 (H) 04/02/2020    No results found for: "CREATININE"  No results found for: "PSA"  No results found for: "TESTOSTERONE"  No results found for: "HGBA1C"  Urinalysis    Component Value Date/Time   APPEARANCEUR Clear 01/24/2022 1326   GLUCOSEU Negative 01/24/2022 1326   BILIRUBINUR Negative 01/24/2022 1326   PROTEINUR Negative 01/24/2022 1326   NITRITE Negative 01/24/2022 1326   LEUKOCYTESUR 1+ (A) 01/24/2022 1326    Lab Results  Component Value Date   LABMICR See below: 01/24/2022   [facility]  6-10 (A) 01/24/2022   LABEPIT 0-10 01/24/2022   BACTERIA Few (A) 01/24/2022    Pertinent Imaging: CT 11/13/2022 and KUB today: Images reviewed and discussed with the patient  Results for orders placed during the hospital encounter of 01/31/22  DG Abd 1 View  Narrative CLINICAL DATA:  Bilateral kidney stones.  EXAM: ABDOMEN - 1 VIEW  COMPARISON:  CT, 01/16/2022.  FINDINGS: 6 mm stone projects in the lower pole of the right kidney. Tiny probable stone projects in the upper pole of the right kidney. No other intrarenal stones.  No evidence of a ureteral stone. The small stone noted  in the distal left ureter on the prior CT is not visualized radiographically.  Normal bowel gas pattern. Status post cholecystectomy. Unremarkable skeletal structures. IUD lies in the central pelvis.  IMPRESSION: 1. No acute findings. The small distal left ureteral stone present on the recent prior CT is not visualized radiographically. 2. 2 right intrarenal stones stable from the prior CT.   Electronically Signed By: Amie Portland M.D. On: 01/31/2022 14:27  No results found for this or any previous visit.  No results found for this or any previous visit.  No results found for this or any previous visit.  Results for orders placed during the hospital encounter of 01/31/22  US RENAL  Narrative CLINICAL DATA:  Nephrolithiasis  EXAM: RENAL / URINARY TRACT ULTRASOUND COMPLETE  COMPARISON:  CT abdomen pelvis 01/16/2022  FINDINGS: Right Kidney:  Renal measurements: 12.2 x 5.1 x 6.4 cm = volume: 209 mL. Echogenicity within normal limits. No hydronephrosis. There is a shadowing calculus in the inferior pole measuring 8 mm. No Mass.  Left Kidney:  Renal measurements: 12.5 x 4.9 x 5.4 cm = volume: 171 mL. Echogenicity within normal limits. No mass or hydronephrosis visualized. No shadowing calculus.  Bladder:  Appears normal for degree of bladder distention.  Other:  None.  IMPRESSION: 1. Nonobstructing 8 mm calculus in the inferior right kidney.  2.  No left renal calculi or hydronephrosis.   Electronically Signed By: Emmaline Kluver M.D. On: 01/31/2022 13:43  No valid procedures specified. No results found for this or any previous visit.  No results found for this or any previous visit.   Assessment & Plan:    1. Kidney stones -We discussed the management of kidney stones. These options include observation, ureteroscopy, shockwave lithotripsy (ESWL) and percutaneous nephrolithotomy (PCNL). We discussed which options are relevant to the patient's  stone(s). We discussed the natural history of kidney stones as well as the complications of untreated stones and the impact on quality of life without treatment as well as with each of the above listed treatments. We also discussed the efficacy of each treatment in its ability to clear the stone burden. With any of these management options I discussed the signs and symptoms of infection and the need for emergent treatment should these be experienced. For each option we discussed the ability of each procedure to clear the patient of their stone burden.   For observation I described the risks which include but are not limited to silent renal damage, life-threatening infection, need for emergent surgery, failure to pass stone and pain.   For ureteroscopy I described the risks which include bleeding, infection, damage to contiguous structures, positioning injury, ureteral stricture, ureteral avulsion, ureteral injury, need for prolonged ureteral stent, inability to perform ureteroscopy, need for an interval procedure, inability to clear stone burden, stent discomfort/pain, heart attack, stroke, pulmonary embolus and the inherent  risks with general anesthesia.   For shockwave lithotripsy I described the risks which include arrhythmia, kidney contusion, kidney hemorrhage, need for transfusion, pain, inability to adequately break up stone, inability to pass stone fragments, Steinstrasse, infection associated with obstructing stones, need for alternate surgical procedure, need for repeat shockwave lithotripsy, MI, CVA, PE and the inherent risks with anesthesia/conscious sedation.   For PCNL I described the risks including positioning injury, pneumothorax, hydrothorax, need for chest tube, inability to clear stone burden, renal laceration, arterial venous fistula or malformation, need for embolization of kidney, loss of kidney or renal function, need for repeat procedure, need for prolonged nephrostomy tube, ureteral  avulsion, MI, CVA, PE and the inherent risks of general anesthesia.   - The patient would like to proceed with Medical expulsive therapy.  Rx for percocet and zofran given  - Urinalysis, Routine w reflex microscopic   No follow-ups on file.  Wilkie Aye, MD  [facility] [place]

## 2022-12-02 ENCOUNTER — Ambulatory Visit (HOSPITAL_COMMUNITY)
Admission: RE | Admit: 2022-12-02 | Discharge: 2022-12-02 | Disposition: A | Discharge status: Home / Self Care | Payer: BC Managed Care – PPO | Source: Ambulatory Visit | Attending: Urology | Admitting: Urology

## 2022-12-02 ENCOUNTER — Ambulatory Visit: Payer: BC Managed Care – PPO | Admitting: Urology

## 2022-12-02 ENCOUNTER — Encounter: Payer: Self-pay | Admitting: Urology

## 2022-12-02 VITALS — BP 123/67 | HR 89

## 2022-12-02 DIAGNOSIS — N202 Calculus of kidney with calculus of ureter: Secondary | ICD-10-CM

## 2022-12-02 DIAGNOSIS — N200 Calculus of kidney: Secondary | ICD-10-CM

## 2022-12-02 LAB — URINALYSIS, ROUTINE W REFLEX MICROSCOPIC
Bilirubin, UA: NEGATIVE
Glucose, UA: NEGATIVE
Ketones, UA: NEGATIVE
Nitrite, UA: NEGATIVE
Protein,UA: NEGATIVE
Specific Gravity, UA: <1.005 — ABNORMAL LOW (ref 1.005–1.030)
Urobilinogen, Ur: 0.2 mg/dL (ref 0.2–1.0)
pH, UA: 6 (ref 5.0–7.5)

## 2022-12-02 LAB — MICROSCOPIC EXAMINATION: Epithelial Cells (non renal): >10 /hpf — AB (ref 0–10)

## 2022-12-02 MED ORDER — NITROFURANTOIN MONOHYD MACRO 100 MG PO CAPS: 100.0000 mg | ORAL_CAPSULE | Freq: Two times a day (BID) | ORAL | 0 refills | Status: DC

## 2022-12-02 NOTE — H&P (View-Only) (Signed)
12/02/2022 9:23 AM   Fabienne Bruns 1985/08/24 [id number]  Referring provider: Benita Stabile, MD [address] [place],  [place] [address]  No chief complaint on file.   HPI: Lindsay Andrade is a 37yo here for followup for nephrolithiasis. She has not passed her calculus. KUB shows distal right ureteral calculus and right lower pole calculus. She has not had flank pain since Wednesday. No worsening LUTS   PMH: Past Medical History:  Diagnosis Date   Anxiety    Asthma    hx of    Headache     Surgical History: Past Surgical History:  Procedure Laterality Date   CHOLECYSTECTOMY N/A 04/10/2020   Procedure: LAPAROSCOPIC CHOLECYSTECTOMY WITH INTRAOPERATIVE CHOLANGIOGRAM;  Surgeon: Luretha Murphy, MD;  Location: [facility];  Service: General;  Laterality: N/A;   NO PAST SURGERIES      Home Medications:  Allergies as of 12/02/2022   No Known Allergies      Medication List        Accurate as of December 02, 2022  9:23 AM. If you have any questions, ask your nurse or doctor.          buPROPion 300 MG 24 hr tablet Commonly known as: WELLBUTRIN XL Take 300 mg by mouth daily.   ibuprofen 200 MG tablet Commonly known as: ADVIL Take 800 mg by mouth every 6 (six) hours as needed for headache or moderate pain.   nitrofurantoin (macrocrystal-monohydrate) 100 MG capsule Commonly known as: MACROBID Take 1 capsule (100 mg total) by mouth every 12 (twelve) hours.   ondansetron 4 MG tablet Commonly known as: Zofran Take 1 tablet (4 mg total) by mouth every 8 (eight) hours as needed for nausea or vomiting.   oxyCODONE-acetaminophen 5-325 MG tablet Commonly known as: Percocet Take 1 tablet by mouth every 4 (four) hours as needed.   pantoprazole 40 MG tablet Commonly known as: PROTONIX Take 40 mg by mouth daily.   sertraline 25 MG tablet Commonly known as: ZOLOFT Take 25 mg by mouth daily.   tamsulosin 0.4 MG Caps capsule Commonly known as: FLOMAX Take 0.4 mg by mouth at  bedtime.        Allergies: No Known Allergies  Family History: No family history on file.  Social History:  reports that she has quit smoking. She has never used smokeless tobacco. She reports that she does not drink alcohol and does not use drugs.  ROS: All other review of systems were reviewed and are negative except what is noted above in HPI  Physical Exam: BP 123/67   Pulse 89   Constitutional:  Alert and oriented, No acute distress. HEENT: [place] AT, moist mucus membranes.  Trachea midline, no masses. Cardiovascular: No clubbing, cyanosis, or edema. Respiratory: Normal respiratory effort, no increased work of breathing. GI: Abdomen is soft, nontender, nondistended, no abdominal masses GU: No CVA tenderness.  Lymph: No cervical or inguinal lymphadenopathy. Skin: No rashes, bruises or suspicious lesions. Neurologic: Grossly intact, no focal deficits, moving all 4 extremities. Psychiatric: Normal mood and affect.  Laboratory Data: Lab Results  Component Value Date   WBC 8.7 04/02/2020   HGB 12.5 04/02/2020   HCT 36.1 04/02/2020   MCV 91.4 04/02/2020   PLT 524 (H) 04/02/2020    No results found for: "CREATININE"  No results found for: "PSA"  No results found for: "TESTOSTERONE"  No results found for: "HGBA1C"  Urinalysis    Component Value Date/Time   APPEARANCEUR Cloudy (A) 11/28/2022 0827  GLUCOSEU Negative 11/28/2022 0827   BILIRUBINUR Negative 11/28/2022 0827   PROTEINUR Negative 11/28/2022 0827   NITRITE Negative 11/28/2022 0827   LEUKOCYTESUR 2+ (A) 11/28/2022 0827    Lab Results  Component Value Date   LABMICR See below: 11/28/2022   WBCUA 11-30 (A) 11/28/2022   LABEPIT 0-10 11/28/2022   BACTERIA Moderate (A) 11/28/2022    Pertinent Imaging: KUb today: Images reviewed and discussed with the patient Results for orders placed during the hospital encounter of 01/31/22  DG Abd 1 View  Narrative CLINICAL DATA:  Bilateral kidney  stones.  EXAM: ABDOMEN - 1 VIEW  COMPARISON:  CT, 01/16/2022.  FINDINGS: 6 mm stone projects in the lower pole of the right kidney. Tiny probable stone projects in the upper pole of the right kidney. No other intrarenal stones.  No evidence of a ureteral stone. The small stone noted in the distal left ureter on the prior CT is not visualized radiographically.  Normal bowel gas pattern. Status post cholecystectomy. Unremarkable skeletal structures. IUD lies in the central pelvis.  IMPRESSION: 1. No acute findings. The small distal left ureteral stone present on the recent prior CT is not visualized radiographically. 2. 2 right intrarenal stones stable from the prior CT.   Electronically Signed By: Amie Portland M.D. On: 01/31/2022 14:27  No results found for this or any previous visit.  No results found for this or any previous visit.  No results found for this or any previous visit.  Results for orders placed during the hospital encounter of 01/31/22  US RENAL  Narrative CLINICAL DATA:  Nephrolithiasis  EXAM: RENAL / URINARY TRACT ULTRASOUND COMPLETE  COMPARISON:  CT abdomen pelvis 01/16/2022  FINDINGS: Right Kidney:  Renal measurements: 12.2 x 5.1 x 6.4 cm = volume: 209 mL. Echogenicity within normal limits. No hydronephrosis. There is a shadowing calculus in the inferior pole measuring 8 mm. No Mass.  Left Kidney:  Renal measurements: 12.5 x 4.9 x 5.4 cm = volume: 171 mL. Echogenicity within normal limits. No mass or hydronephrosis visualized. No shadowing calculus.  Bladder:  Appears normal for degree of bladder distention.  Other:  None.  IMPRESSION: 1. Nonobstructing 8 mm calculus in the inferior right kidney.  2.  No left renal calculi or hydronephrosis.   Electronically Signed By: Emmaline Kluver M.D. On: 01/31/2022 13:43  No valid procedures specified. No results found for this or any previous visit.  No results found for this  or any previous visit.   Assessment & Plan:    1. Kidney stones -We discussed the management of kidney stones. These options include observation, ureteroscopy, shockwave lithotripsy (ESWL) and percutaneous nephrolithotomy (PCNL). We discussed which options are relevant to the patient's stone(s). We discussed the natural history of kidney stones as well as the complications of untreated stones and the impact on quality of life without treatment as well as with each of the above listed treatments. We also discussed the efficacy of each treatment in its ability to clear the stone burden. With any of these management options I discussed the signs and symptoms of infection and the need for emergent treatment should these be experienced. For each option we discussed the ability of each procedure to clear the patient of their stone burden.   For observation I described the risks which include but are not limited to silent renal damage, life-threatening infection, need for emergent surgery, failure to pass stone and pain.   For ureteroscopy I described the risks which include  bleeding, infection, damage to contiguous structures, positioning injury, ureteral stricture, ureteral avulsion, ureteral injury, need for prolonged ureteral stent, inability to perform ureteroscopy, need for an interval procedure, inability to clear stone burden, stent discomfort/pain, heart attack, stroke, pulmonary embolus and the inherent risks with general anesthesia.   For shockwave lithotripsy I described the risks which include arrhythmia, kidney contusion, kidney hemorrhage, need for transfusion, pain, inability to adequately break up stone, inability to pass stone fragments, Steinstrasse, infection associated with obstructing stones, need for alternate surgical procedure, need for repeat shockwave lithotripsy, MI, CVA, PE and the inherent risks with anesthesia/conscious sedation.   For PCNL I described the risks including  positioning injury, pneumothorax, hydrothorax, need for chest tube, inability to clear stone burden, renal laceration, arterial venous fistula or malformation, need for embolization of kidney, loss of kidney or renal function, need for repeat procedure, need for prolonged nephrostomy tube, ureteral avulsion, MI, CVA, PE and the inherent risks of general anesthesia.   - The patient would like to proceed with right ESWL - Urinalysis, Routine w reflex microscopic - Urine Culture   No follow-ups on file.  Wilkie Aye, MD  [facility] [place]

## 2022-12-02 NOTE — Progress Notes (Unsigned)
12/02/2022 9:23 AM   Lindsay Andrade 1985/08/24 [id number]  Referring provider: Benita Stabile, MD [address] [place],  [place] [address]  No chief complaint on file.   HPI: [name] is a 37yo here for followup for nephrolithiasis. She has not passed her calculus. KUB shows distal right ureteral calculus and right lower pole calculus. She has not had flank pain since Wednesday. No worsening LUTS   PMH: Past Medical History:  Diagnosis Date   Anxiety    Asthma    hx of    Headache     Surgical History: Past Surgical History:  Procedure Laterality Date   CHOLECYSTECTOMY N/A 04/10/2020   Procedure: LAPAROSCOPIC CHOLECYSTECTOMY WITH INTRAOPERATIVE CHOLANGIOGRAM;  Surgeon: Luretha Murphy, MD;  Location: [facility];  Service: General;  Laterality: N/A;   NO PAST SURGERIES      Home Medications:  Allergies as of 12/02/2022   No Known Allergies      Medication List        Accurate as of December 02, 2022  9:23 AM. If you have any questions, ask your nurse or doctor.          buPROPion 300 MG 24 hr tablet Commonly known as: WELLBUTRIN XL Take 300 mg by mouth daily.   ibuprofen 200 MG tablet Commonly known as: ADVIL Take 800 mg by mouth every 6 (six) hours as needed for headache or moderate pain.   nitrofurantoin (macrocrystal-monohydrate) 100 MG capsule Commonly known as: MACROBID Take 1 capsule (100 mg total) by mouth every 12 (twelve) hours.   ondansetron 4 MG tablet Commonly known as: Zofran Take 1 tablet (4 mg total) by mouth every 8 (eight) hours as needed for nausea or vomiting.   oxyCODONE-acetaminophen 5-325 MG tablet Commonly known as: Percocet Take 1 tablet by mouth every 4 (four) hours as needed.   pantoprazole 40 MG tablet Commonly known as: PROTONIX Take 40 mg by mouth daily.   sertraline 25 MG tablet Commonly known as: ZOLOFT Take 25 mg by mouth daily.   tamsulosin 0.4 MG Caps capsule Commonly known as: FLOMAX Take 0.4 mg by mouth at  bedtime.        Allergies: No Known Allergies  Family History: No family history on file.  Social History:  reports that she has quit smoking. She has never used smokeless tobacco. She reports that she does not drink alcohol and does not use drugs.  ROS: All other review of systems were reviewed and are negative except what is noted above in HPI  Physical Exam: BP 123/67   Pulse 89   Constitutional:  Alert and oriented, No acute distress. HEENT: [place] AT, moist mucus membranes.  Trachea midline, no masses. Cardiovascular: No clubbing, cyanosis, or edema. Respiratory: Normal respiratory effort, no increased work of breathing. GI: Abdomen is soft, nontender, nondistended, no abdominal masses GU: No CVA tenderness.  Lymph: No cervical or inguinal lymphadenopathy. Skin: No rashes, bruises or suspicious lesions. Neurologic: Grossly intact, no focal deficits, moving all 4 extremities. Psychiatric: Normal mood and affect.  Laboratory Data: Lab Results  Component Value Date   WBC 8.7 04/02/2020   HGB 12.5 04/02/2020   HCT 36.1 04/02/2020   MCV 91.4 04/02/2020   PLT 524 (H) 04/02/2020    No results found for: "CREATININE"  No results found for: "PSA"  No results found for: "TESTOSTERONE"  No results found for: "HGBA1C"  Urinalysis    Component Value Date/Time   APPEARANCEUR Cloudy (A) 11/28/2022 0827  GLUCOSEU Negative 11/28/2022 0827   BILIRUBINUR Negative 11/28/2022 0827   PROTEINUR Negative 11/28/2022 0827   NITRITE Negative 11/28/2022 0827   LEUKOCYTESUR 2+ (A) 11/28/2022 0827    Lab Results  Component Value Date   LABMICR See below: 11/28/2022   WBCUA 11-30 (A) 11/28/2022   LABEPIT 0-10 11/28/2022   BACTERIA Moderate (A) 11/28/2022    Pertinent Imaging: KUb today: Images reviewed and discussed with the patient Results for orders placed during the hospital encounter of 01/31/22  DG Abd 1 View  Narrative CLINICAL DATA:  Bilateral kidney  stones.  EXAM: ABDOMEN - 1 VIEW  COMPARISON:  CT, 01/16/2022.  FINDINGS: 6 mm stone projects in the lower pole of the right kidney. Tiny probable stone projects in the upper pole of the right kidney. No other intrarenal stones.  No evidence of a ureteral stone. The small stone noted in the distal left ureter on the prior CT is not visualized radiographically.  Normal bowel gas pattern. Status post cholecystectomy. Unremarkable skeletal structures. IUD lies in the central pelvis.  IMPRESSION: 1. No acute findings. The small distal left ureteral stone present on the recent prior CT is not visualized radiographically. 2. 2 right intrarenal stones stable from the prior CT.   Electronically Signed By: Amie Portland M.D. On: 01/31/2022 14:27  No results found for this or any previous visit.  No results found for this or any previous visit.  No results found for this or any previous visit.  Results for orders placed during the hospital encounter of 01/31/22  US RENAL  Narrative CLINICAL DATA:  Nephrolithiasis  EXAM: RENAL / URINARY TRACT ULTRASOUND COMPLETE  COMPARISON:  CT abdomen pelvis 01/16/2022  FINDINGS: Right Kidney:  Renal measurements: 12.2 x 5.1 x 6.4 cm = volume: 209 mL. Echogenicity within normal limits. No hydronephrosis. There is a shadowing calculus in the inferior pole measuring 8 mm. No Mass.  Left Kidney:  Renal measurements: 12.5 x 4.9 x 5.4 cm = volume: 171 mL. Echogenicity within normal limits. No mass or hydronephrosis visualized. No shadowing calculus.  Bladder:  Appears normal for degree of bladder distention.  Other:  None.  IMPRESSION: 1. Nonobstructing 8 mm calculus in the inferior right kidney.  2.  No left renal calculi or hydronephrosis.   Electronically Signed By: Emmaline Kluver M.D. On: 01/31/2022 13:43  No valid procedures specified. No results found for this or any previous visit.  No results found for this  or any previous visit.   Assessment & Plan:    1. Kidney stones -We discussed the management of kidney stones. These options include observation, ureteroscopy, shockwave lithotripsy (ESWL) and percutaneous nephrolithotomy (PCNL). We discussed which options are relevant to the patient's stone(s). We discussed the natural history of kidney stones as well as the complications of untreated stones and the impact on quality of life without treatment as well as with each of the above listed treatments. We also discussed the efficacy of each treatment in its ability to clear the stone burden. With any of these management options I discussed the signs and symptoms of infection and the need for emergent treatment should these be experienced. For each option we discussed the ability of each procedure to clear the patient of their stone burden.   For observation I described the risks which include but are not limited to silent renal damage, life-threatening infection, need for emergent surgery, failure to pass stone and pain.   For ureteroscopy I described the risks which include  bleeding, infection, damage to contiguous structures, positioning injury, ureteral stricture, ureteral avulsion, ureteral injury, need for prolonged ureteral stent, inability to perform ureteroscopy, need for an interval procedure, inability to clear stone burden, stent discomfort/pain, heart attack, stroke, pulmonary embolus and the inherent risks with general anesthesia.   For shockwave lithotripsy I described the risks which include arrhythmia, kidney contusion, kidney hemorrhage, need for transfusion, pain, inability to adequately break up stone, inability to pass stone fragments, Steinstrasse, infection associated with obstructing stones, need for alternate surgical procedure, need for repeat shockwave lithotripsy, MI, CVA, PE and the inherent risks with anesthesia/conscious sedation.   For PCNL I described the risks including  positioning injury, pneumothorax, hydrothorax, need for chest tube, inability to clear stone burden, renal laceration, arterial venous fistula or malformation, need for embolization of kidney, loss of kidney or renal function, need for repeat procedure, need for prolonged nephrostomy tube, ureteral avulsion, MI, CVA, PE and the inherent risks of general anesthesia.   - The patient would like to proceed with right ESWL - Urinalysis, Routine w reflex microscopic - Urine Culture   No follow-ups on file.  Wilkie Aye, MD  [facility] [place]

## 2022-12-02 NOTE — Patient Instructions (Signed)
ESWL for Kidney Stones  Extracorporeal shock wave lithotripsy (ESWL) is a treatment that can help break up kidney stones that are too large to pass on their own.  This is a nonsurgical procedure that breaks up a kidney stone with shock waves. These shock waves pass through your body and focus on the kidney stone. They cause the kidney stone to break into smaller pieces (fragments) while it is still in the urinary tract. The fragments of stone can pass more easily out of your body in the urine. Tell a health care provider about: Any allergies you have. All medicines you are taking, including vitamins, herbs, eye drops, creams, and over-the-counter medicines. Any problems you or family members have had with anesthetic medicines. Any bleeding problems you have. Any surgeries you have had. Any medical conditions you have. Whether you are pregnant or may be pregnant. What are the risks? Your health care provider will talk with you about risks. These may include: Infection. Bleeding from the kidney. Bruising of the kidney or skin. Scarring of the kidney. This can lead to: Increased blood pressure. Poor kidney function. Return (recurrence) of kidney stones. Damage to other structures or organs. This may include the liver, colon, spleen, or pancreas. Blockage (obstruction) of the tube that carries urine from the kidney to the bladder (ureter). Failure of the kidney stone to break into fragments. What happens before the procedure? When to stop eating and drinking Follow instructions from your health care provider about what you may eat and drink. These may include: 8 hours before your procedure Stop eating most foods. Do not eat meat, fried foods, or fatty foods. Eat only light foods, such as toast or crackers. All liquids are okay except energy drinks and alcohol. 6 hours before your procedure Stop eating. Drink only clear liquids, such as water, clear fruit juice, black coffee, plain tea,  and sports drinks. Do not drink energy drinks or alcohol. 2 hours before your procedure Stop drinking all liquids. You may be allowed to take medicines with small sips of water. If you do not follow your health care provider's instructions, your procedure may be delayed or canceled. Medicines Ask your health care provider about: Changing or stopping your regular medicines. These include any diabetes medicines or blood thinners you take. Taking medicines such as aspirin and ibuprofen. These medicines can thin your blood. Do not take them unless your health care provider tells you to. Taking over-the-counter medicines, vitamins, herbs, and supplements. Tests You may have tests, such as: Blood tests. Urine tests. Imaging tests. This may include a CT scan. Surgery safety Ask your health care provider: How your surgery site will be marked. What steps will be taken to help prevent infection. These steps may include: Washing skin with a soap that kills germs. Receiving antibiotics. General instructions If you will be going home right after the procedure, plan to have a responsible adult: Take you home from the hospital or clinic. You will not be allowed to drive. Care for you for the time you are told. What happens during the procedure?  An IV will be inserted into one of your veins. You may be given: A sedative. This helps you relax. Anesthesia. This will: Numb certain areas of your body. Make you fall asleep for surgery. A water-filled cushion may be placed behind your kidney or on your abdomen. In some cases, you may be placed in a tub of lukewarm water. Your body will be positioned in a way that makes it   easier to target the kidney stone. An X-ray or ultrasound exam will be done to locate your stone. Shock waves will be aimed at the stone. If you are awake, you may feel a tapping sensation as the shock waves pass through your body. A small mesh tube (stent) may be placed in your  ureter. This will help keep urine flowing from the kidney if the fragments of the stone have been blocking the ureter. The stent will be removed at a later time by your health care provider. The procedure may vary among health care providers and hospitals. What happens after the procedure? Your blood pressure, heart rate, breathing rate, and blood oxygen level will be monitored until you leave the hospital or clinic. You may have an X-ray after the procedure to see how many of the kidney stones were broken up. This will also show how much of the stone has passed. If there are still large fragments after treatment, you may need to have a second procedure at a later time. This information is not intended to replace advice given to you by your health care provider. Make sure you discuss any questions you have with your health care provider. Document Revised: 09/23/2021 Document Reviewed: 09/23/2021 Elsevier Patient Education  2024 [employer]  

## 2022-12-04 LAB — URINE CULTURE

## 2022-12-05 ENCOUNTER — Encounter (HOSPITAL_COMMUNITY)
Admission: RE | Admit: 2022-12-05 | Discharge: 2022-12-05 | Disposition: A | Discharge status: Home / Self Care | Payer: BC Managed Care – PPO | Source: Ambulatory Visit | Attending: Urology | Admitting: Urology

## 2022-12-06 ENCOUNTER — Encounter (HOSPITAL_COMMUNITY)
Admission: RE | Disposition: A | Discharge status: Home / Self Care | Payer: Self-pay | Source: Home / Self Care | Attending: Urology

## 2022-12-06 ENCOUNTER — Ambulatory Visit (HOSPITAL_COMMUNITY)
Admission: RE | Admit: 2022-12-06 | Discharge: 2022-12-06 | Disposition: A | Discharge status: Home / Self Care | Payer: BC Managed Care – PPO | Source: Home / Self Care | Attending: Urology | Admitting: Urology

## 2022-12-06 ENCOUNTER — Ambulatory Visit (HOSPITAL_COMMUNITY)
Admission: RE | Admit: 2022-12-06 | Discharge: 2022-12-06 | Disposition: A | Discharge status: Home / Self Care | Payer: BC Managed Care – PPO | Attending: Urology | Admitting: Urology

## 2022-12-06 DIAGNOSIS — Z01818 Encounter for other preprocedural examination: Secondary | ICD-10-CM

## 2022-12-06 DIAGNOSIS — N200 Calculus of kidney: Secondary | ICD-10-CM | POA: Insufficient documentation

## 2022-12-06 DIAGNOSIS — N201 Calculus of ureter: Secondary | ICD-10-CM | POA: Diagnosis not present

## 2022-12-06 HISTORY — PX: EXTRACORPOREAL SHOCK WAVE LITHOTRIPSY: SHX1557

## 2022-12-06 LAB — POCT PREGNANCY, URINE: Preg Test, Ur: NEGATIVE

## 2022-12-06 SURGERY — LITHOTRIPSY, ESWL
Anesthesia: LOCAL | Laterality: Right

## 2022-12-06 MED ORDER — DIPHENHYDRAMINE HCL 25 MG PO CAPS
25.0000 mg | ORAL_CAPSULE | ORAL | Status: AC
  Administered 2022-12-06: 25 mg via ORAL
  Filled 2022-12-06: qty 1

## 2022-12-06 MED ORDER — DIAZEPAM 5 MG PO TABS
10.0000 mg | ORAL_TABLET | Freq: Once | ORAL | Status: AC
  Administered 2022-12-06: 10 mg via ORAL
  Filled 2022-12-06: qty 2

## 2022-12-06 MED ORDER — ONDANSETRON HCL 4 MG PO TABS: 4.0000 mg | ORAL_TABLET | Freq: Three times a day (TID) | ORAL | 0 refills | Status: DC | PRN

## 2022-12-06 MED ORDER — SODIUM CHLORIDE 0.9 % IV SOLN: INTRAVENOUS | Status: DC

## 2022-12-06 MED ORDER — TAMSULOSIN HCL 0.4 MG PO CAPS: 0.4000 mg | ORAL_CAPSULE | Freq: Every day | ORAL | 0 refills | Status: DC

## 2022-12-06 MED ORDER — OXYCODONE-ACETAMINOPHEN 5-325 MG PO TABS: 1.0000 | ORAL_TABLET | ORAL | 0 refills | Status: DC | PRN

## 2022-12-06 NOTE — Interval H&P Note (Signed)
History and Physical Interval Note:  12/06/2022 7:48 AM  Lindsay Andrade  has presented today for surgery, with the diagnosis of Right ureteral calculus.  The various methods of treatment have been discussed with the patient and family. After consideration of risks, benefits and other options for treatment, the patient has consented to  Procedure(s): EXTRACORPOREAL SHOCK WAVE LITHOTRIPSY (ESWL) (Right) as a surgical intervention.  The patient's history has been reviewed, patient examined, no change in status, stable for surgery.  I have reviewed the patient's chart and labs.  Questions were answered to the patient's satisfaction.     Wilkie Aye

## 2022-12-06 NOTE — Progress Notes (Signed)
Patient was able to void 550 mls of pale yellow urine prior to discharge; no fragments noted.

## 2022-12-09 ENCOUNTER — Encounter (HOSPITAL_COMMUNITY): Payer: Self-pay | Admitting: Urology

## 2022-12-22 ENCOUNTER — Telehealth: Payer: Self-pay

## 2022-12-22 ENCOUNTER — Other Ambulatory Visit: Payer: Self-pay | Admitting: Urology

## 2022-12-22 DIAGNOSIS — N200 Calculus of kidney: Secondary | ICD-10-CM | POA: Insufficient documentation

## 2022-12-22 DIAGNOSIS — N201 Calculus of ureter: Secondary | ICD-10-CM

## 2022-12-22 NOTE — Progress Notes (Unsigned)
Name: Lindsay Andrade DOB: [birth year] MRN: [id number]  Diagnoses: 1) Post-operative state  HPI: Lindsay Andrade presents post-operatively s/p right ESWL procedure on 12/06/2022 by Dr. Ronne Binning for management of a right distal ureteral stone.  Postop course: KUB today: Awaiting radiology read; right distal ureteral stone no longer visible per my interpretation. Right intrarenal stones present.  Today She reports pain in right flank / right low back, RLQ abdominal area, and right thigh pain down to the knee. She states that was present since before the procedure. She reports fatigue and some nausea when occurs. Denies fevers. She reports some urinary hesitancy. She denies increased urinary urgency, frequency, nocturia, dysuria, gross hematuria, straining to void, or sensations of incomplete emptying.   Fall Screening: Do you usually have a device to assist in your mobility? No   Medications: Current Outpatient Medications  Medication Sig Dispense Refill   buPROPion (WELLBUTRIN XL) 300 MG 24 hr tablet Take 300 mg by mouth daily.     ibuprofen (ADVIL) 200 MG tablet Take 800 mg by mouth every 6 (six) hours as needed for headache or moderate pain. (Patient not taking: Reported on 09/09/2022)     nitrofurantoin, macrocrystal-monohydrate, (MACROBID) 100 MG capsule Take 1 capsule (100 mg total) by mouth every 12 (twelve) hours. 14 capsule 0   ondansetron (ZOFRAN) 4 MG tablet Take 1 tablet (4 mg total) by mouth every 8 (eight) hours as needed for nausea or vomiting. 30 tablet 0   oxyCODONE-acetaminophen (PERCOCET) 5-325 MG tablet Take 1 tablet by mouth every 4 (four) hours as needed. 30 tablet 0   pantoprazole (PROTONIX) 40 MG tablet Take 40 mg by mouth daily.     sertraline (ZOLOFT) 25 MG tablet Take 25 mg by mouth daily. (Patient not taking: Reported on 09/09/2022)     tamsulosin (FLOMAX) 0.4 MG CAPS capsule Take 1 capsule (0.4 mg total) by mouth at bedtime. 30 capsule 0   No current facility-administered  medications for this visit.    Allergies: No Known Allergies  Past Medical History:  Diagnosis Date   Anxiety    Asthma    hx of    Headache    Past Surgical History:  Procedure Laterality Date   CHOLECYSTECTOMY N/A 04/10/2020   Procedure: LAPAROSCOPIC CHOLECYSTECTOMY WITH INTRAOPERATIVE CHOLANGIOGRAM;  Surgeon: Luretha Murphy, MD;  Location: [facility];  Service: General;  Laterality: N/A;   EXTRACORPOREAL SHOCK WAVE LITHOTRIPSY Right 12/06/2022   Procedure: EXTRACORPOREAL SHOCK WAVE LITHOTRIPSY (ESWL);  Surgeon: Malen Gauze, MD;  Location: [facility];  Service: Urology;  Laterality: Right;   NO PAST SURGERIES     History reviewed. No pertinent family history. Social History   Socioeconomic History   Marital status: Married    Spouse name: Not on file   Number of children: 2   Years of education: 12   Highest education level: Not on file  Occupational History   Not on file  Tobacco Use   Smoking status: Former   Smokeless tobacco: Never  Vaping Use   Vaping status: Some Days   Substances: Nicotine  Substance and Sexual Activity   Alcohol use: Never   Drug use: Never   Sexual activity: Yes  Other Topics Concern   Not on file  Social History Narrative   Not on file   Social Determinants of Health   Financial Resource Strain: Not on file  Food Insecurity: Not on file  Transportation Needs: Not on file  Physical Activity: Not on file  Stress: Not on  file  Social Connections: Not on file  Intimate Partner Violence: Not on file    SUBJECTIVE  Review of Systems Constitutional: Patient denies any unintentional weight loss or change in strength lntegumentary: Patient denies any rashes or pruritus Cardiovascular: Patient denies chest pain or syncope Respiratory: Patient denies shortness of breath Gastrointestinal: Patient denies nausea, vomiting, constipation, or diarrhea Musculoskeletal: Patient denies muscle cramps or weakness Neurologic: Patient denies  convulsions or seizures Psychiatric: Patient denies memory problems Allergic/Immunologic: Patient denies recent allergic reaction(s) Hematologic/Lymphatic: Patient denies bleeding tendencies Endocrine: Patient denies heat/cold intolerance  GU: As per HPI.  OBJECTIVE There were no vitals filed for this visit. There is no height or weight on file to calculate BMI.  Physical Examination  Constitutional: No obvious distress; patient is non-toxic appearing  Cardiovascular: No visible lower extremity edema.  Respiratory: The patient does not have audible wheezing/stridor; respirations do not appear labored  Gastrointestinal: Abdomen non-distended Musculoskeletal: Normal ROM of UEs  Skin: No obvious rashes/open sores  Neurologic: CN 2-12 grossly intact Psychiatric: Answered questions appropriately with normal affect  Hematologic/Lymphatic/Immunologic: No obvious bruises or sites of spontaneous bleeding  UA: positive for >30 WBC/hpf, 11-30 RBC/hpf, bacteria (moderate)  ASSESSMENT Right ureteral stone - Plan: Urinalysis, Routine w reflex microscopic, tamsulosin (FLOMAX) 0.4 MG CAPS capsule, CT RENAL STONE STUDY  Kidney stones - Plan: Urinalysis, Routine w reflex microscopic, tamsulosin (FLOMAX) 0.4 MG CAPS capsule, nitrofurantoin, macrocrystal-monohydrate, (MACROBID) 100 MG capsule, CT RENAL STONE STUDY  Postop check - Plan: Urinalysis, Routine w reflex microscopic, CT RENAL STONE STUDY  Right lower quadrant abdominal pain - Plan: CT RENAL STONE STUDY, CANCELED: CT RENAL STONE STUDY  Abnormal urinalysis - Plan: nitrofurantoin, macrocrystal-monohydrate, (MACROBID) 100 MG capsule, CT RENAL STONE STUDY  Right flank pain - Plan: tamsulosin (FLOMAX) 0.4 MG CAPS capsule, CT RENAL STONE STUDY  We reviewed the operative procedures and findings.  We reviewed recent imaging results; awaiting radiology results - the distal right ureteral stone is no longer visible however it appears there is a  larger stone at the right UPJ. Consulted with Dr. Ronne Binning, who advised stat CT to re-evaluate stones. Patient was advised that a repeat procedure will likely be necessary - either right ESWL or right ureteroscopic stone manipulation.   Staff was unable to process insurance approval at the time of today's visit due to a global computer systems issue with [employer] which was impacting [employer] as well, therefore patient was advised that if symptoms become severe she should go to the ER for stat CT imaging. We also discussed the possibility of right ureteral stent placement in ER if needed.   Will continue Flomax for medical expulsive therapy at this time and Zofran PRN for nausea. Discussed OTC versus opioid analgesics PRN for pain; she declined Percocet refill.  Abnormal UA. Will check urine culture and treat empirically with Macrobid while awaiting culture results and sensitivities.  Pt verbalized understanding and agreement. All questions were answered.  PLAN Advised the following: 1. CT ordered (stat). 2. Urine culture.  3. Macrobid 2x/day x7 days. 3. Flomax 0.4 mg daily. 4. Zofran PRN.  5. Analgesics PRN.  6. Next steps to be determined by Dr. Ronne Binning based on CT findings.   Orders Placed This Encounter  Procedures   Microscopic Examination   CT RENAL STONE STUDY    Standing Status:   Future    Standing Expiration Date:   12/23/2023    Order Specific Question:   Is patient pregnant?    Answer:  No    Order Specific Question:   Preferred imaging location?    Answer:   [facility]   Urinalysis, Routine w reflex microscopic    It has been explained that the patient is to follow regularly with their PCP in addition to all other providers involved in their care and to follow instructions provided by these respective offices. Patient advised to contact urology clinic if any urologic-pertaining questions, concerns, new symptoms or problems arise in the interim  period.  There are no Patient Instructions on file for this visit.  Electronically signed by:  Donnita Falls, MSN, FNP-C, CUNP 12/26/2022 9:15 AM

## 2022-12-22 NOTE — Telephone Encounter (Signed)
Patient is made aware to get KUB done before scheduled appointment tomorrow with Maralyn Sago. Patient states she do not think she passed any stones after procedure and she been hurting for about a week. Patient states she will get KUB in the morning and voiced understanding.

## 2022-12-23 ENCOUNTER — Ambulatory Visit (INDEPENDENT_AMBULATORY_CARE_PROVIDER_SITE_OTHER): Payer: BC Managed Care – PPO | Admitting: Urology

## 2022-12-23 ENCOUNTER — Ambulatory Visit (HOSPITAL_COMMUNITY)
Admission: RE | Admit: 2022-12-23 | Discharge: 2022-12-23 | Disposition: A | Discharge status: Home / Self Care | Payer: BC Managed Care – PPO | Source: Ambulatory Visit | Attending: Urology | Admitting: Urology

## 2022-12-23 ENCOUNTER — Encounter: Payer: Self-pay | Admitting: Urology

## 2022-12-23 DIAGNOSIS — N200 Calculus of kidney: Secondary | ICD-10-CM | POA: Insufficient documentation

## 2022-12-23 DIAGNOSIS — Z09 Encounter for follow-up examination after completed treatment for conditions other than malignant neoplasm: Secondary | ICD-10-CM

## 2022-12-23 DIAGNOSIS — R8290 Unspecified abnormal findings in urine: Secondary | ICD-10-CM

## 2022-12-23 DIAGNOSIS — N2889 Other specified disorders of kidney and ureter: Secondary | ICD-10-CM | POA: Diagnosis not present

## 2022-12-23 DIAGNOSIS — R1031 Right lower quadrant pain: Secondary | ICD-10-CM

## 2022-12-23 DIAGNOSIS — N201 Calculus of ureter: Secondary | ICD-10-CM

## 2022-12-23 DIAGNOSIS — R109 Unspecified abdominal pain: Secondary | ICD-10-CM

## 2022-12-23 LAB — URINALYSIS, ROUTINE W REFLEX MICROSCOPIC
Bilirubin, UA: NEGATIVE
Glucose, UA: NEGATIVE
Ketones, UA: NEGATIVE
Nitrite, UA: NEGATIVE
Protein,UA: NEGATIVE
Specific Gravity, UA: <1.005 — ABNORMAL LOW (ref 1.005–1.030)
Urobilinogen, Ur: 0.2 mg/dL (ref 0.2–1.0)
pH, UA: 6 (ref 5.0–7.5)

## 2022-12-23 LAB — MICROSCOPIC EXAMINATION: WBC, UA: >30 /hpf — AB (ref 0–5)

## 2022-12-23 MED ORDER — TAMSULOSIN HCL 0.4 MG PO CAPS
0.4000 mg | ORAL_CAPSULE | Freq: Every day | ORAL | 0 refills | Status: DC
Start: 2022-12-23 — End: 2023-02-23

## 2022-12-23 MED ORDER — NITROFURANTOIN MONOHYD MACRO 100 MG PO CAPS
100.0000 mg | ORAL_CAPSULE | Freq: Two times a day (BID) | ORAL | 0 refills | Status: DC
Start: 2022-12-23 — End: 2023-03-23

## 2022-12-26 ENCOUNTER — Encounter: Payer: BC Managed Care – PPO | Admitting: Urology

## 2022-12-29 ENCOUNTER — Ambulatory Visit (HOSPITAL_COMMUNITY)
Admission: RE | Admit: 2022-12-29 | Discharge: 2022-12-29 | Disposition: A | Discharge status: Home / Self Care | Payer: BC Managed Care – PPO | Source: Ambulatory Visit | Attending: Urology | Admitting: Urology

## 2022-12-29 DIAGNOSIS — Z09 Encounter for follow-up examination after completed treatment for conditions other than malignant neoplasm: Secondary | ICD-10-CM | POA: Insufficient documentation

## 2022-12-29 DIAGNOSIS — R109 Unspecified abdominal pain: Secondary | ICD-10-CM | POA: Diagnosis not present

## 2022-12-29 DIAGNOSIS — N200 Calculus of kidney: Secondary | ICD-10-CM | POA: Diagnosis not present

## 2022-12-29 DIAGNOSIS — R8290 Unspecified abnormal findings in urine: Secondary | ICD-10-CM | POA: Diagnosis not present

## 2022-12-29 DIAGNOSIS — N201 Calculus of ureter: Secondary | ICD-10-CM | POA: Diagnosis not present

## 2022-12-29 DIAGNOSIS — R319 Hematuria, unspecified: Secondary | ICD-10-CM | POA: Diagnosis not present

## 2022-12-29 DIAGNOSIS — R1031 Right lower quadrant pain: Secondary | ICD-10-CM | POA: Insufficient documentation

## 2022-12-29 DIAGNOSIS — N202 Calculus of kidney with calculus of ureter: Secondary | ICD-10-CM | POA: Diagnosis not present

## 2023-01-04 ENCOUNTER — Other Ambulatory Visit: Payer: Self-pay | Admitting: Urology

## 2023-01-04 ENCOUNTER — Telehealth: Payer: Self-pay

## 2023-01-04 MED ORDER — OXYCODONE-ACETAMINOPHEN 5-325 MG PO TABS: 1.0000 | ORAL_TABLET | ORAL | 0 refills | Status: DC | PRN

## 2023-01-04 NOTE — Telephone Encounter (Signed)
Patient is made aware "Dr. Ronne Binning - We consulted about this patient on 12/23/2022 and you advised stat CT to re-evaluate stones. Patient still highly symptomatic. For stone management do you advise right ESWL or right ureteroscopic stone manipulation? Also given that you are about to go on vacation should case be posted for one of the other urologists to be done ASAP? Please advise." Patient states that she would like to get both stone out at the same time. Per Dr. Ronne Binning right ureteroscopic stone manipulation would get both stone out but it could be about 6 weeks before scheduling procedure. Patient is made aware that the surgery scheduler will give her call with potential surgery dates and pain medication has been sent in per Dr. Ronne Binning. Patient voiced understanding

## 2023-01-04 NOTE — Telephone Encounter (Signed)
Patient is calling back to check on results of CT scan.  Is still in a lot of pain.  Please advise as soon as possible.

## 2023-01-24 ENCOUNTER — Telehealth: Payer: Self-pay

## 2023-01-24 NOTE — Telephone Encounter (Signed)
Patient is following up on her surgery that is supposed to be scheduled with you.  Please add to the workque.  Possible surgery date 09/19.

## 2023-01-26 ENCOUNTER — Other Ambulatory Visit: Payer: Self-pay | Admitting: Urology

## 2023-01-26 DIAGNOSIS — N200 Calculus of kidney: Secondary | ICD-10-CM

## 2023-01-27 NOTE — Telephone Encounter (Signed)
Surgery posted 09/19 My chart message sent to patient.

## 2023-02-07 DIAGNOSIS — N6320 Unspecified lump in the left breast, unspecified quadrant: Secondary | ICD-10-CM | POA: Diagnosis not present

## 2023-02-07 DIAGNOSIS — N63 Unspecified lump in unspecified breast: Secondary | ICD-10-CM | POA: Diagnosis not present

## 2023-02-09 ENCOUNTER — Other Ambulatory Visit (HOSPITAL_COMMUNITY): Payer: Self-pay | Admitting: Internal Medicine

## 2023-02-09 DIAGNOSIS — N63 Unspecified lump in unspecified breast: Secondary | ICD-10-CM

## 2023-02-20 NOTE — Patient Instructions (Signed)
Lindsay Andrade  02/20/2023     @PREFPERIOPPHARMACY @   Your procedure is scheduled on  02/23/2023.   Report to [name] at  0800  A.M.    Call this number if you have problems the morning of surgery:  [phone]  If you experience any cold or flu symptoms such as cough, fever, chills, shortness of breath, etc. between now and your scheduled surgery, please notify us at the above number.   Remember:  Do not eat or drink after midnight.      Take these medicines the morning of surgery with A SIP OF WATER         wellbutrin, zofran (if needed), oxycodone(if needed), pantoprazole, sertraline.     Do not wear jewelry, make-up or nail polish, including gel polish,  artificial nails, or any other type of covering on natural nails (fingers and  toes).  Do not wear lotions, powders, or perfumes, or deodorant.  Do not shave 48 hours prior to surgery.  Men may shave face and neck.  Do not bring valuables to the hospital.  [facility] is not responsible for any belongings or valuables.  Contacts, dentures or bridgework may not be worn into surgery.  Leave your suitcase in the car.  After surgery it may be brought to your room.  For patients admitted to the hospital, discharge time will be determined by your treatment team.  Patients discharged the day of surgery will not be allowed to drive home and must have someone with them for 24 hours.    Special instructions:   DO NOT smoke tobacco or vape for 24 hours before your procedure.  Please read over the following fact sheets that you were given. Coughing and Deep Breathing, Surgical Site Infection Prevention, Anesthesia Post-op Instructions, and Care and Recovery After Surgery       Ureteral Stent Implantation, Care After The following information offers guidance on how to care for yourself after your procedure. Your health care provider may also give you more specific instructions. If you have problems or questions,  contact your health care provider. What can I expect after the procedure? After the procedure, it is common to have: Nausea. Mild pain when you urinate. You may feel this pain in your lower back or lower abdomen. The pain should stop within a few minutes after you urinate. This pattern may last for up to 1 week. A small amount of blood in your urine for several days. Follow these instructions at home: Medicines Take over-the-counter and prescription medicines only as told by your health care provider. If you were prescribed antibiotics, take them as told by your health care provider. Do not stop using the antibiotic even if you start to feel better. If you were given a sedative during the procedure, it can affect you for several hours. Do not drive or operate machinery until your health care provider says that it is safe. Ask your health care provider if the medicine prescribed to you: Requires you to avoid driving or using machinery. Can cause constipation. You may need to take these actions to prevent or treat constipation: Take over-the-counter or prescription medicines. Eat foods that are high in fiber, such as beans, whole grains, and fresh fruits and vegetables. Limit foods that are high in fat and processed sugars, such as fried or sweet foods. Activity Rest as told by your health care provider. Do not sit for a long time without moving. Get  up to take short walks every 1-2 hours. This will improve blood flow and breathing. Ask for help if you feel weak or unsteady. Return to your normal activities as told by your health care provider. Ask your health care provider what activities are safe for you. General instructions  If you have a catheter: Follow instructions from your health care provider about taking care of your catheter and collection bag. Do not take baths, swim, or use a hot tub until your health care provider approves. Ask your health care provider if you may take showers.  You may only be allowed to take sponge baths. Drink enough fluid to keep your urine pale yellow. Do not use any products that contain nicotine or tobacco. These products include cigarettes, chewing tobacco, and vaping devices, such as e-cigarettes. These can delay healing after surgery. If you need help quitting, ask your health care provider. Keep all follow-up visits. Contact a health care provider if: You start passing blood clots, or you have more than a small amount of blood in your urine. You have pain that gets worse or does not get better with medicine, especially pain when you urinate. You have trouble urinating. You feel nauseous or you vomit again and again during a period of more than 2 days after the procedure. You have a fever. Get help right away if: You are passing blood clots that are 1 inch (2.5 cm) or larger in size. You are leaking urine (have incontinence), or you cannot urinate. The end of the stent comes out of your urethra. You have sudden, sharp, or severe pain in your abdomen or lower back. You have swelling or pain in your legs. You have trouble breathing. These symptoms may be an emergency. Get help right away. Call 911. Do not wait to see if the symptoms will go away. Do not drive yourself to the hospital. Summary After the procedure, it is common to have mild pain when you urinate that goes away within a few minutes after you urinate. This may last for up to 1 week. Take over-the-counter and prescription medicines only as told by your health care provider. Drink enough fluid to keep your urine pale yellow. Call your health care provider if you start passing blood clots, or you have more than a small amount of blood in your urine. This information is not intended to replace advice given to you by your health care provider. Make sure you discuss any questions you have with your health care provider. Document Revised: 06/28/2021 Document Reviewed:  06/28/2021 Elsevier Patient Education  2024 [employer] General Anesthesia, Adult, Care After The following information offers guidance on how to care for yourself after your procedure. Your health care provider may also give you more specific instructions. If you have problems or questions, contact your health care provider. What can I expect after the procedure? After the procedure, it is common for people to: Have pain or discomfort at the IV site. Have nausea or vomiting. Have a sore throat or hoarseness. Have trouble concentrating. Feel cold or chills. Feel weak, sleepy, or tired (fatigue). Have soreness and body aches. These can affect parts of the body that were not involved in surgery. Follow these instructions at home: For the time period you were told by your health care provider:  Rest. Do not participate in activities where you could fall or become injured. Do not drive or use machinery. Do not drink alcohol. Do not take sleeping pills or medicines that cause  drowsiness. Do not make important decisions or sign legal documents. Do not take care of children on your own. General instructions Drink enough fluid to keep your urine pale yellow. If you have sleep apnea, surgery and certain medicines can increase your risk for breathing problems. Follow instructions from your health care provider about wearing your sleep device: Anytime you are sleeping, including during daytime naps. While taking prescription pain medicines, sleeping medicines, or medicines that make you drowsy. Return to your normal activities as told by your health care provider. Ask your health care provider what activities are safe for you. Take over-the-counter and prescription medicines only as told by your health care provider. Do not use any products that contain nicotine or tobacco. These products include cigarettes, chewing tobacco, and vaping devices, such as e-cigarettes. These can delay incision  healing after surgery. If you need help quitting, ask your health care provider. Contact a health care provider if: You have nausea or vomiting that does not get better with medicine. You vomit every time you eat or drink. You have pain that does not get better with medicine. You cannot urinate or have bloody urine. You develop a skin rash. You have a fever. Get help right away if: You have trouble breathing. You have chest pain. You vomit blood. These symptoms may be an emergency. Get help right away. Call 911. Do not wait to see if the symptoms will go away. Do not drive yourself to the hospital. Summary After the procedure, it is common to have a sore throat, hoarseness, nausea, vomiting, or to feel weak, sleepy, or fatigue. For the time period you were told by your health care provider, do not drive or use machinery. Get help right away if you have difficulty breathing, have chest pain, or vomit blood. These symptoms may be an emergency. This information is not intended to replace advice given to you by your health care provider. Make sure you discuss any questions you have with your health care provider. Document Revised: 08/20/2021 Document Reviewed: 08/20/2021 Elsevier Patient Education  2024 [employer] How to Use Chlorhexidine Before Surgery Chlorhexidine gluconate (CHG) is a germ-killing (antiseptic) solution that is used to clean the skin. It can get rid of the bacteria that normally live on the skin and can keep them away for about 24 hours. To clean your skin with CHG, you may be given: A CHG solution to use in the shower or as part of a sponge bath. A prepackaged cloth that contains CHG. Cleaning your skin with CHG may help lower the risk for infection: While you are staying in the intensive care unit of the hospital. If you have a vascular access, such as a central line, to provide short-term or long-term access to your veins. If you have a catheter to drain urine from  your bladder. If you are on a ventilator. A ventilator is a machine that helps you breathe by moving air in and out of your lungs. After surgery. What are the risks? Risks of using CHG include: A skin reaction. Hearing loss, if CHG gets in your ears and you have a perforated eardrum. Eye injury, if CHG gets in your eyes and is not rinsed out. The CHG product catching fire. Make sure that you avoid smoking and flames after applying CHG to your skin. Do not use CHG: If you have a chlorhexidine allergy or have previously reacted to chlorhexidine. On babies younger than 9 months of age. How to use CHG solution Use  CHG only as told by your health care provider, and follow the instructions on the label. Use the full amount of CHG as directed. Usually, this is one bottle. During a shower Follow these steps when using CHG solution during a shower (unless your health care provider gives you different instructions): Start the shower. Use your normal soap and shampoo to wash your face and hair. Turn off the shower or move out of the shower stream. Pour the CHG onto a clean washcloth. Do not use any type of brush or rough-edged sponge. Starting at your neck, lather your body down to your toes. Make sure you follow these instructions: If you will be having surgery, pay special attention to the part of your body where you will be having surgery. Scrub this area for at least 1 minute. Do not use CHG on your head or face. If the solution gets into your ears or eyes, rinse them well with water. Avoid your genital area. Avoid any areas of skin that have broken skin, cuts, or scrapes. Scrub your back and under your arms. Make sure to wash skin folds. Let the lather sit on your skin for 1-2 minutes or as long as told by your health care provider. Thoroughly rinse your entire body in the shower. Make sure that all body creases and crevices are rinsed well. Dry off with a clean towel. Do not put any  substances on your body afterward--such as powder, lotion, or perfume--unless you are told to do so by your health care provider. Only use lotions that are recommended by the manufacturer. Put on clean clothes or pajamas. If it is the night before your surgery, sleep in clean sheets.  During a sponge bath Follow these steps when using CHG solution during a sponge bath (unless your health care provider gives you different instructions): Use your normal soap and shampoo to wash your face and hair. Pour the CHG onto a clean washcloth. Starting at your neck, lather your body down to your toes. Make sure you follow these instructions: If you will be having surgery, pay special attention to the part of your body where you will be having surgery. Scrub this area for at least 1 minute. Do not use CHG on your head or face. If the solution gets into your ears or eyes, rinse them well with water. Avoid your genital area. Avoid any areas of skin that have broken skin, cuts, or scrapes. Scrub your back and under your arms. Make sure to wash skin folds. Let the lather sit on your skin for 1-2 minutes or as long as told by your health care provider. Using a different clean, wet washcloth, thoroughly rinse your entire body. Make sure that all body creases and crevices are rinsed well. Dry off with a clean towel. Do not put any substances on your body afterward--such as powder, lotion, or perfume--unless you are told to do so by your health care provider. Only use lotions that are recommended by the manufacturer. Put on clean clothes or pajamas. If it is the night before your surgery, sleep in clean sheets. How to use CHG prepackaged cloths Only use CHG cloths as told by your health care provider, and follow the instructions on the label. Use the CHG cloth on clean, dry skin. Do not use the CHG cloth on your head or face unless your health care provider tells you to. When washing with the CHG cloth: Avoid  your genital area. Avoid any areas of skin that  have broken skin, cuts, or scrapes. Before surgery Follow these steps when using a CHG cloth to clean before surgery (unless your health care provider gives you different instructions): Using the CHG cloth, vigorously scrub the part of your body where you will be having surgery. Scrub using a back-and-forth motion for 3 minutes. The area on your body should be completely wet with CHG when you are done scrubbing. Do not rinse. Discard the cloth and let the area air-dry. Do not put any substances on the area afterward, such as powder, lotion, or perfume. Put on clean clothes or pajamas. If it is the night before your surgery, sleep in clean sheets.  For general bathing Follow these steps when using CHG cloths for general bathing (unless your health care provider gives you different instructions). Use a separate CHG cloth for each area of your body. Make sure you wash between any folds of skin and between your fingers and toes. Wash your body in the following order, switching to a new cloth after each step: The front of your neck, shoulders, and chest. Both of your arms, under your arms, and your hands. Your stomach and groin area, avoiding the genitals. Your right leg and foot. Your left leg and foot. The back of your neck, your back, and your buttocks. Do not rinse. Discard the cloth and let the area air-dry. Do not put any substances on your body afterward--such as powder, lotion, or perfume--unless you are told to do so by your health care provider. Only use lotions that are recommended by the manufacturer. Put on clean clothes or pajamas. Contact a health care provider if: Your skin gets irritated after scrubbing. You have questions about using your solution or cloth. You swallow any chlorhexidine. Call your local poison control center (619-394-3855 in the [place] Get help right away if: Your eyes itch badly, or they become very red or  swollen. Your skin itches badly and is red or swollen. Your hearing changes. You have trouble seeing. You have swelling or tingling in your mouth or throat. You have trouble breathing. These symptoms may represent a serious problem that is an emergency. Do not wait to see if the symptoms will go away. Get medical help right away. Call your local emergency services (911 in the [place] Do not drive yourself to the hospital. Summary Chlorhexidine gluconate (CHG) is a germ-killing (antiseptic) solution that is used to clean the skin. Cleaning your skin with CHG may help to lower your risk for infection. You may be given CHG to use for bathing. It may be in a bottle or in a prepackaged cloth to use on your skin. Carefully follow your health care provider's instructions and the instructions on the product label. Do not use CHG if you have a chlorhexidine allergy. Contact your health care provider if your skin gets irritated after scrubbing. This information is not intended to replace advice given to you by your health care provider. Make sure you discuss any questions you have with your health care provider. Document Revised: 09/20/2021 Document Reviewed: 08/03/2020 Elsevier Patient Education  2023 [employer].

## 2023-02-21 ENCOUNTER — Encounter (HOSPITAL_COMMUNITY): Payer: Self-pay

## 2023-02-21 ENCOUNTER — Encounter (HOSPITAL_COMMUNITY)
Admission: RE | Admit: 2023-02-21 | Discharge: 2023-02-21 | Disposition: A | Discharge status: Home / Self Care | Payer: BC Managed Care – PPO | Source: Ambulatory Visit | Attending: Urology | Admitting: Urology

## 2023-02-21 ENCOUNTER — Other Ambulatory Visit: Payer: Self-pay

## 2023-02-21 VITALS — BP 116/74 | HR 77 | Temp 97.8°F | Resp 16 | Ht 62.0 in | Wt 151.0 lb

## 2023-02-21 DIAGNOSIS — Q632 Ectopic kidney: Secondary | ICD-10-CM | POA: Diagnosis not present

## 2023-02-21 DIAGNOSIS — Z01812 Encounter for preprocedural laboratory examination: Secondary | ICD-10-CM | POA: Insufficient documentation

## 2023-02-21 DIAGNOSIS — Z87891 Personal history of nicotine dependence: Secondary | ICD-10-CM | POA: Diagnosis not present

## 2023-02-21 DIAGNOSIS — Z01818 Encounter for other preprocedural examination: Secondary | ICD-10-CM

## 2023-02-21 DIAGNOSIS — J45909 Unspecified asthma, uncomplicated: Secondary | ICD-10-CM | POA: Diagnosis not present

## 2023-02-21 DIAGNOSIS — N202 Calculus of kidney with calculus of ureter: Secondary | ICD-10-CM | POA: Diagnosis not present

## 2023-02-21 DIAGNOSIS — F419 Anxiety disorder, unspecified: Secondary | ICD-10-CM | POA: Diagnosis not present

## 2023-02-21 HISTORY — DX: Personal history of urinary calculi: Z87.442

## 2023-02-21 LAB — POCT PREGNANCY, URINE: Preg Test, Ur: NEGATIVE

## 2023-02-22 ENCOUNTER — Encounter: Payer: BC Managed Care – PPO | Admitting: Urology

## 2023-02-22 DIAGNOSIS — R92343 Mammographic extreme density, bilateral breasts: Secondary | ICD-10-CM | POA: Diagnosis not present

## 2023-02-22 DIAGNOSIS — N6321 Unspecified lump in the left breast, upper outer quadrant: Secondary | ICD-10-CM | POA: Diagnosis not present

## 2023-02-22 DIAGNOSIS — N6002 Solitary cyst of left breast: Secondary | ICD-10-CM | POA: Diagnosis not present

## 2023-02-22 DIAGNOSIS — R92342 Mammographic extreme density, left breast: Secondary | ICD-10-CM | POA: Diagnosis not present

## 2023-02-23 ENCOUNTER — Telehealth: Payer: Self-pay | Admitting: Urology

## 2023-02-23 ENCOUNTER — Encounter (HOSPITAL_COMMUNITY): Payer: Self-pay | Admitting: Urology

## 2023-02-23 ENCOUNTER — Encounter (HOSPITAL_COMMUNITY)
Admission: RE | Disposition: A | Discharge status: Home / Self Care | Payer: Self-pay | Source: Home / Self Care | Attending: Urology

## 2023-02-23 ENCOUNTER — Ambulatory Visit (HOSPITAL_COMMUNITY): Payer: BC Managed Care – PPO | Admitting: Anesthesiology

## 2023-02-23 ENCOUNTER — Ambulatory Visit (HOSPITAL_COMMUNITY): Payer: BC Managed Care – PPO

## 2023-02-23 ENCOUNTER — Ambulatory Visit (HOSPITAL_COMMUNITY)
Admission: RE | Admit: 2023-02-23 | Discharge: 2023-02-23 | Disposition: A | Discharge status: Home / Self Care | Payer: BC Managed Care – PPO | Attending: Urology | Admitting: Urology

## 2023-02-23 DIAGNOSIS — Z87891 Personal history of nicotine dependence: Secondary | ICD-10-CM | POA: Diagnosis not present

## 2023-02-23 DIAGNOSIS — N200 Calculus of kidney: Secondary | ICD-10-CM

## 2023-02-23 DIAGNOSIS — N201 Calculus of ureter: Secondary | ICD-10-CM

## 2023-02-23 DIAGNOSIS — F419 Anxiety disorder, unspecified: Secondary | ICD-10-CM | POA: Diagnosis not present

## 2023-02-23 DIAGNOSIS — R109 Unspecified abdominal pain: Secondary | ICD-10-CM

## 2023-02-23 DIAGNOSIS — N202 Calculus of kidney with calculus of ureter: Secondary | ICD-10-CM | POA: Insufficient documentation

## 2023-02-23 DIAGNOSIS — N1339 Other hydronephrosis: Secondary | ICD-10-CM | POA: Diagnosis not present

## 2023-02-23 DIAGNOSIS — J45909 Unspecified asthma, uncomplicated: Secondary | ICD-10-CM | POA: Diagnosis not present

## 2023-02-23 DIAGNOSIS — Q632 Ectopic kidney: Secondary | ICD-10-CM | POA: Insufficient documentation

## 2023-02-23 HISTORY — PX: CYSTOSCOPY WITH RETROGRADE PYELOGRAM, URETEROSCOPY AND STENT PLACEMENT: SHX5789

## 2023-02-23 SURGERY — CYSTOURETEROSCOPY, WITH RETROGRADE PYELOGRAM AND STENT INSERTION
Anesthesia: General | Site: Ureter | Laterality: Right

## 2023-02-23 MED ORDER — OXYCODONE HCL 5 MG PO TABS
5.0000 mg | ORAL_TABLET | Freq: Once | ORAL | Status: AC | PRN
  Administered 2023-02-23: 5 mg via ORAL
  Filled 2023-02-23: qty 1

## 2023-02-23 MED ORDER — LIDOCAINE HCL (PF) 2 % IJ SOLN
INTRAMUSCULAR | Status: AC
  Filled 2023-02-23: qty 5

## 2023-02-23 MED ORDER — KETOROLAC TROMETHAMINE 15 MG/ML IJ SOLN
15.0000 mg | Freq: Once | INTRAMUSCULAR | Status: AC
  Administered 2023-02-23: 15 mg via INTRAVENOUS

## 2023-02-23 MED ORDER — CEFAZOLIN SODIUM-DEXTROSE 2-4 GM/100ML-% IV SOLN
INTRAVENOUS | Status: AC
  Filled 2023-02-23: qty 100

## 2023-02-23 MED ORDER — TAMSULOSIN HCL 0.4 MG PO CAPS
0.4000 mg | ORAL_CAPSULE | Freq: Every day | ORAL | 0 refills | Status: AC
Start: 2023-02-23 — End: ?

## 2023-02-23 MED ORDER — ONDANSETRON HCL 4 MG PO TABS: 4.0000 mg | ORAL_TABLET | Freq: Three times a day (TID) | ORAL | 0 refills | Status: DC | PRN

## 2023-02-23 MED ORDER — FENTANYL CITRATE PF 50 MCG/ML IJ SOSY
PREFILLED_SYRINGE | INTRAMUSCULAR | Status: AC
  Filled 2023-02-23: qty 1

## 2023-02-23 MED ORDER — KETOROLAC TROMETHAMINE 15 MG/ML IJ SOLN
INTRAMUSCULAR | Status: AC
  Filled 2023-02-23: qty 1

## 2023-02-23 MED ORDER — LIDOCAINE HCL (CARDIAC) PF 100 MG/5ML IV SOSY
PREFILLED_SYRINGE | INTRAVENOUS | Status: DC | PRN
  Administered 2023-02-23: 80 mg via INTRAVENOUS

## 2023-02-23 MED ORDER — DIATRIZOATE MEGLUMINE 30 % UR SOLN
URETHRAL | Status: DC | PRN
  Administered 2023-02-23: 7 mL via URETHRAL

## 2023-02-23 MED ORDER — CEFAZOLIN SODIUM-DEXTROSE 2-4 GM/100ML-% IV SOLN
2.0000 g | INTRAVENOUS | Status: AC
  Administered 2023-02-23: 2 g via INTRAVENOUS

## 2023-02-23 MED ORDER — DEXMEDETOMIDINE HCL IN NACL 80 MCG/20ML IV SOLN
INTRAVENOUS | Status: AC
  Filled 2023-02-23: qty 20

## 2023-02-23 MED ORDER — WATER FOR IRRIGATION, STERILE IR SOLN
Status: DC | PRN
  Administered 2023-02-23: 500 mL

## 2023-02-23 MED ORDER — SODIUM CHLORIDE 0.9 % IR SOLN
Status: DC | PRN
  Administered 2023-02-23 (×2): 3000 mL

## 2023-02-23 MED ORDER — OXYCODONE HCL 5 MG/5ML PO SOLN: 5.0000 mg | Freq: Once | ORAL | Status: AC | PRN

## 2023-02-23 MED ORDER — ONDANSETRON HCL 4 MG/2ML IJ SOLN: 4.0000 mg | Freq: Once | INTRAMUSCULAR | Status: DC | PRN

## 2023-02-23 MED ORDER — DEXMEDETOMIDINE HCL IN NACL 80 MCG/20ML IV SOLN
INTRAVENOUS | Status: DC | PRN
Start: 2023-02-23 — End: 2023-02-23
  Administered 2023-02-23: 6 ug via INTRAVENOUS

## 2023-02-23 MED ORDER — FENTANYL CITRATE (PF) 250 MCG/5ML IJ SOLN
INTRAMUSCULAR | Status: DC | PRN
  Administered 2023-02-23: 50 ug via INTRAVENOUS
  Administered 2023-02-23: 25 ug via INTRAVENOUS
  Administered 2023-02-23: 50 ug via INTRAVENOUS
  Administered 2023-02-23: 25 ug via INTRAVENOUS

## 2023-02-23 MED ORDER — FENTANYL CITRATE (PF) 100 MCG/2ML IJ SOLN
INTRAMUSCULAR | Status: AC
  Filled 2023-02-23: qty 2

## 2023-02-23 MED ORDER — CHLORHEXIDINE GLUCONATE 0.12 % MT SOLN
OROMUCOSAL | Status: AC
  Administered 2023-02-23: 15 mL via OROMUCOSAL
  Filled 2023-02-23: qty 15

## 2023-02-23 MED ORDER — ONDANSETRON HCL 4 MG/2ML IJ SOLN
INTRAMUSCULAR | Status: AC
  Filled 2023-02-23: qty 2

## 2023-02-23 MED ORDER — PROPOFOL 10 MG/ML IV BOLUS
INTRAVENOUS | Status: AC
  Filled 2023-02-23: qty 20

## 2023-02-23 MED ORDER — CHLORHEXIDINE GLUCONATE 0.12 % MT SOLN: 15.0000 mL | Freq: Once | OROMUCOSAL | Status: AC

## 2023-02-23 MED ORDER — ONDANSETRON HCL 4 MG/2ML IJ SOLN
INTRAMUSCULAR | Status: DC | PRN
  Administered 2023-02-23: 4 mg via INTRAVENOUS

## 2023-02-23 MED ORDER — FENTANYL CITRATE PF 50 MCG/ML IJ SOSY
25.0000 ug | PREFILLED_SYRINGE | INTRAMUSCULAR | Status: DC | PRN
  Administered 2023-02-23: 50 ug via INTRAVENOUS
  Filled 2023-02-23: qty 1

## 2023-02-23 MED ORDER — PROPOFOL 10 MG/ML IV BOLUS
INTRAVENOUS | Status: DC | PRN
  Administered 2023-02-23: 180 mg via INTRAVENOUS

## 2023-02-23 MED ORDER — MIDAZOLAM HCL 2 MG/2ML IJ SOLN
INTRAMUSCULAR | Status: AC
  Filled 2023-02-23: qty 2

## 2023-02-23 MED ORDER — LACTATED RINGERS IV SOLN: INTRAVENOUS | Status: DC

## 2023-02-23 MED ORDER — DEXAMETHASONE SODIUM PHOSPHATE 10 MG/ML IJ SOLN
INTRAMUSCULAR | Status: DC | PRN
  Administered 2023-02-23: 6 mg via INTRAVENOUS

## 2023-02-23 MED ORDER — MIDAZOLAM HCL 2 MG/2ML IJ SOLN
INTRAMUSCULAR | Status: DC | PRN
  Administered 2023-02-23: 2 mg via INTRAVENOUS

## 2023-02-23 MED ORDER — DIATRIZOATE MEGLUMINE 30 % UR SOLN
URETHRAL | Status: AC
  Filled 2023-02-23: qty 100

## 2023-02-23 MED ORDER — DEXAMETHASONE SODIUM PHOSPHATE 10 MG/ML IJ SOLN
INTRAMUSCULAR | Status: AC
  Filled 2023-02-23: qty 1

## 2023-02-23 MED ORDER — OXYCODONE-ACETAMINOPHEN 5-325 MG PO TABS: 1.0000 | ORAL_TABLET | ORAL | 0 refills | Status: DC | PRN

## 2023-02-23 SURGICAL SUPPLY — 23 items
BAG DRAIN URO TABLE W/ADPT NS (BAG) ×2 IMPLANT
BAG DRN 8 ADPR NS SKTRN CSTL (BAG) ×2
BAG HAMPER (MISCELLANEOUS) ×2 IMPLANT
CATH INTERMIT  6FR 70CM (CATHETERS) ×2 IMPLANT
CLOTH BEACON ORANGE TIMEOUT ST (SAFETY) ×2 IMPLANT
GLOVE BIO SURGEON STRL SZ8 (GLOVE) ×2 IMPLANT
GLOVE BIOGEL PI IND STRL 6.5 (GLOVE) ×1 IMPLANT
GLOVE BIOGEL PI IND STRL 7.0 (GLOVE) ×3 IMPLANT
GOWN STRL REUS W/TWL LRG LVL3 (GOWN DISPOSABLE) ×2 IMPLANT
GOWN STRL REUS W/TWL XL LVL3 (GOWN DISPOSABLE) ×2 IMPLANT
GUIDEWIRE STR DUAL SENSOR (WIRE) ×2 IMPLANT
GUIDEWIRE STR ZIPWIRE 035X150 (MISCELLANEOUS) ×2 IMPLANT
IV NS IRRIG 3000ML ARTHROMATIC (IV SOLUTION) ×4 IMPLANT
KIT TURNOVER CYSTO (KITS) ×2 IMPLANT
MANIFOLD NEPTUNE II (INSTRUMENTS) ×2 IMPLANT
PACK CYSTO (CUSTOM PROCEDURE TRAY) ×2 IMPLANT
PAD ARMBOARD 7.5X6 YLW CONV (MISCELLANEOUS) ×2 IMPLANT
POSITIONER HEAD 8X9X4 ADT (SOFTGOODS) ×2 IMPLANT
STENT URET 6FRX24 CONTOUR (STENTS) ×1 IMPLANT
SYR 10ML LL (SYRINGE) ×2 IMPLANT
SYR CONTROL 10ML LL (SYRINGE) ×2 IMPLANT
TOWEL OR 17X26 4PK STRL BLUE (TOWEL DISPOSABLE) ×2 IMPLANT
WATER STERILE IRR 500ML POUR (IV SOLUTION) ×2 IMPLANT

## 2023-02-23 NOTE — Anesthesia Preprocedure Evaluation (Signed)
Anesthesia Evaluation  Patient identified by MRN, date of birth, ID band Patient awake    Reviewed: Allergy & Precautions, H&P , NPO status , Patient's Chart, lab work & pertinent test results, reviewed documented beta blocker date and time   Airway Mallampati: II  TM Distance: >3 FB Neck ROM: full    Dental no notable dental hx.    Pulmonary neg pulmonary ROS, asthma , former smoker   Pulmonary exam normal breath sounds clear to auscultation       Cardiovascular Exercise Tolerance: Good negative cardio ROS  Rhythm:regular Rate:Normal     Neuro/Psych  Headaches  Anxiety     negative neurological ROS  negative psych ROS   GI/Hepatic negative GI ROS, Neg liver ROS,,,  Endo/Other  negative endocrine ROS    Renal/GU Renal diseasenegative Renal ROS  negative genitourinary   Musculoskeletal   Abdominal   Peds  Hematology negative hematology ROS (+)   Anesthesia Other Findings   Reproductive/Obstetrics negative OB ROS                             Anesthesia Physical Anesthesia Plan  ASA: 2  Anesthesia Plan: General and General LMA   Post-op Pain Management:    Induction:   PONV Risk Score and Plan: Ondansetron  Airway Management Planned:   Additional Equipment:   Intra-op Plan:   Post-operative Plan:   Informed Consent: I have reviewed the patients History and Physical, chart, labs and discussed the procedure including the risks, benefits and alternatives for the proposed anesthesia with the patient or authorized representative who has indicated his/her understanding and acceptance.     Dental Advisory Given  Plan Discussed with: CRNA  Anesthesia Plan Comments:        Anesthesia Quick Evaluation

## 2023-02-23 NOTE — Op Note (Signed)
Preoperative diagnosis: right renal calculus   Postoperative diagnosis: Same   Procedure: 1 cystoscopy 2.  right retrograde pyelography 3.  Intraoperative fluoroscopy, under one hour, with interpretation 4.  right diagnostic ureteroscopy 5.  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 Narrowing of the UPJ with inability to pass the flexible ureteroscope into the renal pelvis.   Indications: Patient is a 37 year old female with a history of right renal calculi and right flank pain.  After discussing treatment options, she decided proceed with right ureteroscopic stone extraction   Procedure in detail: The patient was brought to the operating room and a brief timeout was done to ensure correct patient, correct procedure, correct site.  General anesthesia was administered patient was placed in dorsal lithotomy position.  Her genitalia was then prepped and draped in usual sterile fashion.  A rigid 22 French cystoscope was passed in the urethra and the bladder.  Bladder was inspected free masses or lesions.  the right ureteral orifices were in the normal orthotopic locations.  a 6 french ureteral catheter was then instilled into the right ureter orifice.  a gentle retrograde was obtained and findings noted above.  we then placed a zip wire through the ureteral catheter and advanced up to the renal pelvis.  we then removed the cystoscope and cannulated the right ureteral orifice with a semirigid ureteroscope.  we then performed ureteroscopy up to the level of the UPJ and into the renal pelvis. We encountered a narrow UPJ. We advanced a sensor wire into the renal pelvis. We then removed the semirigid scope and advanced the digital ureteroscope over  the wire. We advanced to the UPj but were unable to navigate past the UPJ due to narrowing. We then elected to place a stent over the original zipwire. We advanced a 6x24 Ureteral stent up the the renal pelvis. The wire was removed and good coiling was not in the renal pelvis under fluoroscopy and the bladder under direct vision.  the bladder was then drained and this concluded the procedure which was well tolerated by patient.   Complications: None   Condition: Stable, extubated, transferred to PACU   Plan: The patient is to be discharged home and followup in 2 weeks for right ureteroscopic stone extraction

## 2023-02-23 NOTE — Transfer of Care (Signed)
Immediate Anesthesia Transfer of Care Note  Patient: Lindsay Andrade  Procedure(s) Performed: CYSTOSCOPY WITH RETROGRADE PYELOGRAM, DIAGNOSTIC URETEROSCOPY AND STENT PLACEMENT (Right: Ureter) HOLMIUM LASER APPLICATION (Right: Ureter)  Patient Location: PACU  Anesthesia Type:General  Level of Consciousness: drowsy and patient cooperative  Airway & Oxygen Therapy: Patient Spontanous Breathing and Patient connected to face mask oxygen  Post-op Assessment: Report given to RN and Post -op Vital signs reviewed and stable  Post vital signs: Reviewed and stable  Last Vitals:  Vitals Value Taken Time  BP 125/82 02/23/23 1051  Temp 97.5 02/23/23   1051  Pulse 109 02/23/23 1054  Resp 21 02/23/23 1054  SpO2 100 % 02/23/23 1054  Vitals shown include unfiled device data.  Last Pain:  Vitals:   02/23/23 0813  PainSc: 0-No pain         Complications: No notable events documented.

## 2023-02-23 NOTE — H&P (Signed)
HPI: Lindsay Andrade is a 37yo here for right renal stone extraction   Today She reports pain in right flank / right low back, RLQ abdominal area, and right thigh pain down to the knee. She states that was present since before the procedure. She reports fatigue and some nausea when occurs. Denies fevers. She reports some urinary hesitancy. She denies increased urinary urgency, frequency, nocturia, dysuria, gross hematuria, straining to void, or sensations of incomplete emptying.     Fall Screening: Do you usually have a device to assist in your mobility? No    Medications:       Current Outpatient Medications  Medication Sig Dispense Refill   buPROPion (WELLBUTRIN XL) 300 MG 24 hr tablet Take 300 mg by mouth daily.       ibuprofen (ADVIL) 200 MG tablet Take 800 mg by mouth every 6 (six) hours as needed for headache or moderate pain. (Patient not taking: Reported on 09/09/2022)       nitrofurantoin, macrocrystal-monohydrate, (MACROBID) 100 MG capsule Take 1 capsule (100 mg total) by mouth every 12 (twelve) hours. 14 capsule 0   ondansetron (ZOFRAN) 4 MG tablet Take 1 tablet (4 mg total) by mouth every 8 (eight) hours as needed for nausea or vomiting. 30 tablet 0   oxyCODONE-acetaminophen (PERCOCET) 5-325 MG tablet Take 1 tablet by mouth every 4 (four) hours as needed. 30 tablet 0   pantoprazole (PROTONIX) 40 MG tablet Take 40 mg by mouth daily.       sertraline (ZOLOFT) 25 MG tablet Take 25 mg by mouth daily. (Patient not taking: Reported on 09/09/2022)       tamsulosin (FLOMAX) 0.4 MG CAPS capsule Take 1 capsule (0.4 mg total) by mouth at bedtime. 30 capsule 0      No current facility-administered medications for this visit.        Allergies: Allergies  No Known Allergies         Past Medical History:  Diagnosis Date   Anxiety     Asthma      hx of    Headache               Past Surgical History:  Procedure Laterality Date   CHOLECYSTECTOMY N/A 04/10/2020    Procedure:  LAPAROSCOPIC CHOLECYSTECTOMY WITH INTRAOPERATIVE CHOLANGIOGRAM;  Surgeon: Luretha Murphy, MD;  Location: [facility];  Service: General;  Laterality: N/A;   EXTRACORPOREAL SHOCK WAVE LITHOTRIPSY Right 12/06/2022    Procedure: EXTRACORPOREAL SHOCK WAVE LITHOTRIPSY (ESWL);  Surgeon: Malen Gauze, MD;  Location: [facility];  Service: Urology;  Laterality: Right;   NO PAST SURGERIES            History reviewed. No pertinent family history.     Social History         Socioeconomic History   Marital status: Married      Spouse name: Not on file   Number of children: 2   Years of education: 12   Highest education level: Not on file  Occupational History   Not on file  Tobacco Use   Smoking status: Former   Smokeless tobacco: Never  Vaping Use   Vaping status: Some Days   Substances: Nicotine  Substance and Sexual Activity   Alcohol use: Never   Drug use: Never   Sexual activity: Yes  Other Topics Concern   Not on file  Social History Narrative   Not on file    Social Determinants of Health    Financial Resource Strain:  Not on file  Food Insecurity: Not on file  Transportation Needs: Not on file  Physical Activity: Not on file  Stress: Not on file  Social Connections: Not on file  Intimate Partner Violence: Not on file      SUBJECTIVE   Review of Systems Constitutional: Patient denies any unintentional weight loss or change in strength lntegumentary: Patient denies any rashes or pruritus Cardiovascular: Patient denies chest pain or syncope Respiratory: Patient denies shortness of breath Gastrointestinal: Patient denies nausea, vomiting, constipation, or diarrhea Musculoskeletal: Patient denies muscle cramps or weakness Neurologic: Patient denies convulsions or seizures Psychiatric: Patient denies memory problems Allergic/Immunologic: Patient denies recent allergic reaction(s) Hematologic/Lymphatic: Patient denies bleeding tendencies Endocrine: Patient denies heat/cold  intolerance   GU: As per HPI.   OBJECTIVE There were no vitals filed for this visit. There is no height or weight on file to calculate BMI.   Physical Examination  Constitutional: No obvious distress; patient is non-toxic appearing  Cardiovascular: No visible lower extremity edema.  Respiratory: The patient does not have audible wheezing/stridor; respirations do not appear labored  Gastrointestinal: Abdomen non-distended Musculoskeletal: Normal ROM of UEs  Skin: No obvious rashes/open sores  Neurologic: CN 2-12 grossly intact Psychiatric: Answered questions appropriately with normal affect  Hematologic/Lymphatic/Immunologic: No obvious bruises or sites of spontaneous bleeding   UA: positive for >30 WBC/hpf, 11-30 RBC/hpf, bacteria (moderate)   ASSESSMENT Right ureteral stone - Plan: Urinalysis, Routine w reflex microscopic, tamsulosin (FLOMAX) 0.4 MG CAPS capsule, CT RENAL STONE STUDY   Kidney stones - Plan: Urinalysis, Routine w reflex microscopic, tamsulosin (FLOMAX) 0.4 MG CAPS capsule, nitrofurantoin, macrocrystal-monohydrate, (MACROBID) 100 MG capsule, CT RENAL STONE STUDY   Postop check - Plan: Urinalysis, Routine w reflex microscopic, CT RENAL STONE STUDY   Right lower quadrant abdominal pain - Plan: CT RENAL STONE STUDY, CANCELED: CT RENAL STONE STUDY   Abnormal urinalysis - Plan: nitrofurantoin, macrocrystal-monohydrate, (MACROBID) 100 MG capsule, CT RENAL STONE STUDY   Right flank pain - Plan: tamsulosin (FLOMAX) 0.4 MG CAPS capsule, CT RENAL STONE STUDY   We reviewed the operative procedures and findings.  We reviewed recent imaging results; awaiting radiology results - the distal right ureteral stone is no longer visible however it appears there is a larger stone at the right UPJ. Consulted with Dr. Ronne Binning, who advised stat CT to re-evaluate stones. Patient was advised that a repeat procedure will likely be necessary - either right ESWL or right ureteroscopic stone  manipulation.    Staff was unable to process insurance approval at the time of today's visit due to a global computer systems issue with [employer] which was impacting [employer] as well, therefore patient was advised that if symptoms become severe she should go to the ER for stat CT imaging. We also discussed the possibility of right ureteral stent placement in ER if needed.    Will continue Flomax for medical expulsive therapy at this time and Zofran PRN for nausea. Discussed OTC versus opioid analgesics PRN for pain; she declined Percocet refill.   Abnormal UA. Will check urine culture and treat empirically with Macrobid while awaiting culture results and sensitivities.   Pt verbalized understanding and agreement. All questions were answered.   PLAN -We discussed the management of kidney stones. These options include observation, ureteroscopy, shockwave lithotripsy (ESWL) and percutaneous nephrolithotomy (PCNL). We discussed which options are relevant to the patient's stone(s). We discussed the natural history of kidney stones as well as the complications of untreated stones and the impact  on quality of life without treatment as well as with each of the above listed treatments. We also discussed the efficacy of each treatment in its ability to clear the stone burden. With any of these management options I discussed the signs and symptoms of infection and the need for emergent treatment should these be experienced. For each option we discussed the ability of each procedure to clear the patient of their stone burden.   For observation I described the risks which include but are not limited to silent renal damage, life-threatening infection, need for emergent surgery, failure to pass stone and pain.   For ureteroscopy I described the risks which include bleeding, infection, damage to contiguous structures, positioning injury, ureteral stricture, ureteral avulsion, ureteral injury, need for  prolonged ureteral stent, inability to perform ureteroscopy, need for an interval procedure, inability to clear stone burden, stent discomfort/pain, heart attack, stroke, pulmonary embolus and the inherent risks with general anesthesia.   For shockwave lithotripsy I described the risks which include arrhythmia, kidney contusion, kidney hemorrhage, need for transfusion, pain, inability to adequately break up stone, inability to pass stone fragments, Steinstrasse, infection associated with obstructing stones, need for alternate surgical procedure, need for repeat shockwave lithotripsy, MI, CVA, PE and the inherent risks with anesthesia/conscious sedation.   For PCNL I described the risks including positioning injury, pneumothorax, hydrothorax, need for chest tube, inability to clear stone burden, renal laceration, arterial venous fistula or malformation, need for embolization of kidney, loss of kidney or renal function, need for repeat procedure, need for prolonged nephrostomy tube, ureteral avulsion, MI, CVA, PE and the inherent risks of general anesthesia.   - The patient would like to proceed with right ureteroscopic stone extraction

## 2023-02-23 NOTE — Telephone Encounter (Signed)
Please see below and advised

## 2023-02-23 NOTE — Anesthesia Procedure Notes (Signed)
Procedure Name: LMA Insertion Date/Time: 02/23/2023 10:17 AM  Performed by: Oletha Cruel, CRNAPre-anesthesia Checklist: Patient identified, Emergency Drugs available, Suction available and Patient being monitored Patient Re-evaluated:Patient Re-evaluated prior to induction Oxygen Delivery Method: Circle system utilized Preoxygenation: Pre-oxygenation with 100% oxygen Induction Type: IV induction Ventilation: Mask ventilation without difficulty LMA: LMA inserted LMA Size: 4.0 Number of attempts: 1 Placement Confirmation: positive ETCO2, CO2 detector and breath sounds checked- equal and bilateral Tube secured with: Tape Dental Injury: Teeth and Oropharynx as per pre-operative assessment  Comments: Atraumatic insertion of LMA size 4. Lips and teeth remain in preoperative condition.

## 2023-02-23 NOTE — Telephone Encounter (Signed)
Patients husband called, Lindsay Andrade had a stent placed today at [facility] and she is having a lot of pain, he would like a letter for her to be out of work.

## 2023-02-24 ENCOUNTER — Other Ambulatory Visit: Payer: Self-pay

## 2023-02-24 ENCOUNTER — Encounter (HOSPITAL_COMMUNITY): Payer: Self-pay

## 2023-02-24 ENCOUNTER — Emergency Department (HOSPITAL_COMMUNITY): Payer: BC Managed Care – PPO

## 2023-02-24 ENCOUNTER — Ambulatory Visit (INDEPENDENT_AMBULATORY_CARE_PROVIDER_SITE_OTHER): Payer: BC Managed Care – PPO | Admitting: Urology

## 2023-02-24 ENCOUNTER — Emergency Department (HOSPITAL_COMMUNITY)
Admission: EM | Admit: 2023-02-24 | Discharge: 2023-02-24 | Disposition: A | Discharge status: Home / Self Care | Payer: BC Managed Care – PPO | Attending: Emergency Medicine | Admitting: Emergency Medicine

## 2023-02-24 VITALS — BP 126/81 | HR 84

## 2023-02-24 DIAGNOSIS — D75839 Thrombocytosis, unspecified: Secondary | ICD-10-CM | POA: Insufficient documentation

## 2023-02-24 DIAGNOSIS — Z466 Encounter for fitting and adjustment of urinary device: Secondary | ICD-10-CM

## 2023-02-24 DIAGNOSIS — R739 Hyperglycemia, unspecified: Secondary | ICD-10-CM | POA: Insufficient documentation

## 2023-02-24 DIAGNOSIS — R7309 Other abnormal glucose: Secondary | ICD-10-CM

## 2023-02-24 DIAGNOSIS — E1165 Type 2 diabetes mellitus with hyperglycemia: Secondary | ICD-10-CM | POA: Diagnosis not present

## 2023-02-24 DIAGNOSIS — R109 Unspecified abdominal pain: Secondary | ICD-10-CM | POA: Insufficient documentation

## 2023-02-24 DIAGNOSIS — N200 Calculus of kidney: Secondary | ICD-10-CM

## 2023-02-24 DIAGNOSIS — R1031 Right lower quadrant pain: Secondary | ICD-10-CM | POA: Diagnosis not present

## 2023-02-24 LAB — URINALYSIS, ROUTINE W REFLEX MICROSCOPIC
Bacteria, UA: NONE SEEN
Bilirubin Urine: NEGATIVE
Glucose, UA: NEGATIVE mg/dL
Ketones, ur: 5 mg/dL — AB
Nitrite: NEGATIVE
Protein, ur: 100 mg/dL — AB
RBC / HPF: >50 RBC/hpf (ref 0–5)
Specific Gravity, Urine: 1.023 (ref 1.005–1.030)
WBC, UA: >50 WBC/hpf (ref 0–5)
pH: 6 (ref 5.0–8.0)

## 2023-02-24 LAB — CBC
HCT: 39.5 % (ref 36.0–46.0)
Hemoglobin: 13.8 g/dL (ref 12.0–15.0)
MCH: 31.6 pg (ref 26.0–34.0)
MCHC: 34.9 g/dL (ref 30.0–36.0)
MCV: 90.4 fL (ref 80.0–100.0)
Platelets: 532 10*3/uL — ABNORMAL HIGH (ref 150–400)
RBC: 4.37 MIL/uL (ref 3.87–5.11)
RDW: 11.8 % (ref 11.5–15.5)
WBC: 16.1 10*3/uL — ABNORMAL HIGH (ref 4.0–10.5)
nRBC: 0 % (ref 0.0–0.2)

## 2023-02-24 LAB — COMPREHENSIVE METABOLIC PANEL
ALT: 17 U/L (ref 0–44)
AST: 16 U/L (ref 15–41)
Albumin: 4 g/dL (ref 3.5–5.0)
Alkaline Phosphatase: 78 U/L (ref 38–126)
Anion gap: 11 (ref 5–15)
BUN: 13 mg/dL (ref 6–20)
CO2: 23 mmol/L (ref 22–32)
Calcium: 9.3 mg/dL (ref 8.9–10.3)
Chloride: 101 mmol/L (ref 98–111)
Creatinine, Ser: 0.67 mg/dL (ref 0.44–1.00)
GFR, Estimated: >60 mL/min (ref >60)
Glucose, Bld: 140 mg/dL — ABNORMAL HIGH (ref 70–99)
Potassium: 3.8 mmol/L (ref 3.5–5.1)
Sodium: 135 mmol/L (ref 135–145)
Total Bilirubin: 0.4 mg/dL (ref 0.3–1.2)
Total Protein: 7.7 g/dL (ref 6.5–8.1)

## 2023-02-24 LAB — LIPASE, BLOOD: Lipase: 21 U/L (ref 11–51)

## 2023-02-24 MED ORDER — PROCHLORPERAZINE EDISYLATE 10 MG/2ML IJ SOLN
10.0000 mg | Freq: Once | INTRAMUSCULAR | Status: AC
  Administered 2023-02-24: 10 mg via INTRAVENOUS
  Filled 2023-02-24: qty 2

## 2023-02-24 MED ORDER — CIPROFLOXACIN HCL 500 MG PO TABS
500.0000 mg | ORAL_TABLET | Freq: Once | ORAL | Status: AC
Start: 2023-02-24 — End: ?

## 2023-02-24 MED ORDER — HYDROMORPHONE HCL 1 MG/ML IJ SOLN
1.0000 mg | Freq: Once | INTRAMUSCULAR | Status: AC
  Administered 2023-02-24: 1 mg via INTRAVENOUS
  Filled 2023-02-24: qty 1

## 2023-02-24 MED ORDER — ALBUTEROL SULFATE HFA 108 (90 BASE) MCG/ACT IN AERS: 2.0000 | INHALATION_SPRAY | RESPIRATORY_TRACT | 0 refills | Status: DC | PRN

## 2023-02-24 MED ORDER — SODIUM CHLORIDE 0.9 % IV BOLUS
1000.0000 mL | Freq: Once | INTRAVENOUS | Status: AC
  Administered 2023-02-24: 1000 mL via INTRAVENOUS

## 2023-02-24 MED ORDER — KETOROLAC TROMETHAMINE 10 MG PO TABS: 10.0000 mg | ORAL_TABLET | Freq: Three times a day (TID) | ORAL | 0 refills | Status: AC | PRN

## 2023-02-24 MED ORDER — OXYCODONE-ACETAMINOPHEN 5-325 MG PO TABS: 1.0000 | ORAL_TABLET | ORAL | 0 refills | Status: DC | PRN

## 2023-02-24 MED ORDER — PREDNISONE 50 MG PO TABS: 60.0000 mg | ORAL_TABLET | Freq: Once | ORAL | Status: DC

## 2023-02-24 MED ORDER — PREDNISONE 50 MG PO TABS: 50.0000 mg | ORAL_TABLET | Freq: Every day | ORAL | 0 refills | Status: DC

## 2023-02-24 MED ORDER — ONDANSETRON HCL 4 MG/2ML IJ SOLN
4.0000 mg | Freq: Once | INTRAMUSCULAR | Status: AC
  Administered 2023-02-24: 4 mg via INTRAVENOUS
  Filled 2023-02-24: qty 2

## 2023-02-24 NOTE — Progress Notes (Unsigned)
   02/24/23  CC: followup after ureteroscopy  HPI: Lindsay Andrade is a 37yo here with worsening stent colic Blood pressure 126/81, pulse 84. NED. A&Ox3.   No respiratory distress   Abd soft, NT, ND Normal external genitalia with patent urethral meatus  Cystoscopy Procedure Note  Patient identification was confirmed, informed consent was obtained, and patient was prepped using Betadine solution.  Lidocaine jelly was administered per urethral meatus.    Procedure: - Flexible cystoscope introduced, without any difficulty.   - Thorough search of the bladder revealed:    normal urethral meatus    normal urothelium    no stones    no ulcers     no tumors    no urethral polyps    no trabeculation  - Ureteral orifices were normal in position and appearance.  Post-Procedure: - Patient tolerated the procedure well Usin a grasper the right ureteral stent was removed intact.   Assessment/ Plan: -We discussed the management of kidney stones. These options include observation, ureteroscopy, shockwave lithotripsy (ESWL) and percutaneous nephrolithotomy (PCNL). We discussed which options are relevant to the patient's stone(s). We discussed the natural history of kidney stones as well as the complications of untreated stones and the impact on quality of life without treatment as well as with each of the above listed treatments. We also discussed the efficacy of each treatment in its ability to clear the stone burden. With any of these management options I discussed the signs and symptoms of infection and the need for emergent treatment should these be experienced. For each option we discussed the ability of each procedure to clear the patient of their stone burden.   For observation I described the risks which include but are not limited to silent renal damage, life-threatening infection, need for emergent surgery, failure to pass stone and pain.   For ureteroscopy I described the risks which  include bleeding, infection, damage to contiguous structures, positioning injury, ureteral stricture, ureteral avulsion, ureteral injury, need for prolonged ureteral stent, inability to perform ureteroscopy, need for an interval procedure, inability to clear stone burden, stent discomfort/pain, heart attack, stroke, pulmonary embolus and the inherent risks with general anesthesia.   For shockwave lithotripsy I described the risks which include arrhythmia, kidney contusion, kidney hemorrhage, need for transfusion, pain, inability to adequately break up stone, inability to pass stone fragments, Steinstrasse, infection associated with obstructing stones, need for alternate surgical procedure, need for repeat shockwave lithotripsy, MI, CVA, PE and the inherent risks with anesthesia/conscious sedation.   For PCNL I described the risks including positioning injury, pneumothorax, hydrothorax, need for chest tube, inability to clear stone burden, renal laceration, arterial venous fistula or malformation, need for embolization of kidney, loss of kidney or renal function, need for repeat procedure, need for prolonged nephrostomy tube, ureteral avulsion, MI, CVA, PE and the inherent risks of general anesthesia.   - The patient would like to proceed with right ESWL   No follow-ups on file.  Wilkie Aye, MD

## 2023-02-24 NOTE — Anesthesia Postprocedure Evaluation (Signed)
Anesthesia Post Note  Patient: Lindsay Andrade  Procedure(s) Performed: CYSTOSCOPY WITH RETROGRADE PYELOGRAM, DIAGNOSTIC URETEROSCOPY AND STENT PLACEMENT (Right: Ureter)  Patient location during evaluation: Phase II Anesthesia Type: General Level of consciousness: awake Pain management: pain level controlled Vital Signs Assessment: post-procedure vital signs reviewed and stable Respiratory status: spontaneous breathing and respiratory function stable Cardiovascular status: blood pressure returned to baseline and stable Postop Assessment: no headache and no apparent nausea or vomiting Anesthetic complications: no Comments: Late entry   No notable events documented.   Last Vitals:  Vitals:   02/23/23 1130 02/23/23 1142  BP: (!) 133/92 (!) 125/99  Pulse: 89 74  Resp: 20 16  Temp:  36.4 C  SpO2: 98% 100%    Last Pain:  Vitals:   02/23/23 1142  TempSrc: Oral  PainSc: 6                  Windell Norfolk

## 2023-02-24 NOTE — H&P (View-Only) (Signed)
   02/24/23  CC: followup after ureteroscopy  HPI: Ms Llorente is a 37yo here with worsening stent colic Blood pressure 126/81, pulse 84. NED. A&Ox3.   No respiratory distress   Abd soft, NT, ND Normal external genitalia with patent urethral meatus  Cystoscopy Procedure Note  Patient identification was confirmed, informed consent was obtained, and patient was prepped using Betadine solution.  Lidocaine jelly was administered per urethral meatus.    Procedure: - Flexible cystoscope introduced, without any difficulty.   - Thorough search of the bladder revealed:    normal urethral meatus    normal urothelium    no stones    no ulcers     no tumors    no urethral polyps    no trabeculation  - Ureteral orifices were normal in position and appearance.  Post-Procedure: - Patient tolerated the procedure well Usin a grasper the right ureteral stent was removed intact.   Assessment/ Plan: -We discussed the management of kidney stones. These options include observation, ureteroscopy, shockwave lithotripsy (ESWL) and percutaneous nephrolithotomy (PCNL). We discussed which options are relevant to the patient's stone(s). We discussed the natural history of kidney stones as well as the complications of untreated stones and the impact on quality of life without treatment as well as with each of the above listed treatments. We also discussed the efficacy of each treatment in its ability to clear the stone burden. With any of these management options I discussed the signs and symptoms of infection and the need for emergent treatment should these be experienced. For each option we discussed the ability of each procedure to clear the patient of their stone burden.   For observation I described the risks which include but are not limited to silent renal damage, life-threatening infection, need for emergent surgery, failure to pass stone and pain.   For ureteroscopy I described the risks which  include bleeding, infection, damage to contiguous structures, positioning injury, ureteral stricture, ureteral avulsion, ureteral injury, need for prolonged ureteral stent, inability to perform ureteroscopy, need for an interval procedure, inability to clear stone burden, stent discomfort/pain, heart attack, stroke, pulmonary embolus and the inherent risks with general anesthesia.   For shockwave lithotripsy I described the risks which include arrhythmia, kidney contusion, kidney hemorrhage, need for transfusion, pain, inability to adequately break up stone, inability to pass stone fragments, Steinstrasse, infection associated with obstructing stones, need for alternate surgical procedure, need for repeat shockwave lithotripsy, MI, CVA, PE and the inherent risks with anesthesia/conscious sedation.   For PCNL I described the risks including positioning injury, pneumothorax, hydrothorax, need for chest tube, inability to clear stone burden, renal laceration, arterial venous fistula or malformation, need for embolization of kidney, loss of kidney or renal function, need for repeat procedure, need for prolonged nephrostomy tube, ureteral avulsion, MI, CVA, PE and the inherent risks of general anesthesia.   - The patient would like to proceed with right ESWL   No follow-ups on file.  Wilkie Aye, MD

## 2023-02-24 NOTE — Discharge Instructions (Addendum)
Go directly to Dr. Dimas Millin office to have the stent removed.

## 2023-02-24 NOTE — ED Provider Notes (Signed)
[place] EMERGENCY DEPARTMENT AT [facility] Provider Note   CSN: [id number] Arrival date & time: 02/24/23  7829     History  Chief Complaint  Patient presents with   Flank Pain    Lindsay Andrade is a 37 y.o. female.  The history is provided by the patient.  Flank Pain  She has history of kidney stone and had right ureteral stent placed yesterday comes in for worsening right flank pain not relieved with oxycodone at home.  She states that she was hurting from the moment she woke from anesthesia, and pain has been getting worse.  There is associated nausea without vomiting.  She denies fever or chills.  Last dose of oxycodone was at 3 AM.   Home Medications Prior to Admission medications   Medication Sig Start Date End Date Taking? Authorizing Provider  buPROPion (WELLBUTRIN XL) 300 MG 24 hr tablet Take 300 mg by mouth daily. 09/22/22   [provider]  ibuprofen (ADVIL) 200 MG tablet Take 800 mg by mouth every 6 (six) hours as needed for headache or moderate pain. Patient not taking: Reported on 09/09/2022    [provider]  nitrofurantoin, macrocrystal-monohydrate, (MACROBID) 100 MG capsule Take 1 capsule (100 mg total) by mouth every 12 (twelve) hours. 12/23/22   Donnita Falls, FNP  ondansetron (ZOFRAN) 4 MG tablet Take 1 tablet (4 mg total) by mouth every 8 (eight) hours as needed for nausea or vomiting. 02/23/23   McKenzie, Mardene Celeste, MD  oxyCODONE-acetaminophen (PERCOCET) 5-325 MG tablet Take 1 tablet by mouth every 4 (four) hours as needed. 02/23/23   McKenzie, Mardene Celeste, MD  oxymetazoline (AFRIN) 0.05 % nasal spray Place 1 spray into both nostrils 2 (two) times daily as needed for congestion.    [provider]  pantoprazole (PROTONIX) 40 MG tablet Take 40 mg by mouth daily.    [provider]  sertraline (ZOLOFT) 25 MG tablet Take 25 mg by mouth daily. 03/27/20   [provider]  tamsulosin (FLOMAX) 0.4 MG CAPS capsule Take  1 capsule (0.4 mg total) by mouth at bedtime. 02/23/23   McKenzie, Mardene Celeste, MD      Allergies    Patient has no known allergies.    Review of Systems   Review of Systems  Genitourinary:  Positive for flank pain.  All other systems reviewed and are negative.   Physical Exam Updated Vital Signs BP 99/76   Pulse 74   Temp (!) 97.5 F (36.4 C) (Oral)   Resp 20   SpO2 96%  Physical Exam Vitals and nursing note reviewed.   37 year old female, in obvious pain, but is in no acute distress. Vital signs are normal. Oxygen saturation is 96%, which is normal. Head is normocephalic and atraumatic. PERRLA, EOMI.  Back is nontender in the midline.  There is moderate right CVA tenderness. Lungs are clear without rales, wheezes, or rhonchi. Chest is nontender. Heart has regular rate and rhythm without murmur. Abdomen is soft, flat, with mild to moderate tenderness in the right mid and lower abdomen.  There is no rebound or guarding. Extremities have no cyanosis or edema, full range of motion is present. Skin is warm and dry without rash. Neurologic: Mental status is normal, cranial nerves are intact, moves all extremities equally.  ED Results / Procedures / Treatments   Labs (all labs ordered are listed, but only abnormal results are displayed) Labs Reviewed  COMPREHENSIVE METABOLIC PANEL - Abnormal; Notable for  the following components:      Result Value   Glucose, Bld 140 (*)    All other components within normal limits  CBC - Abnormal; Notable for the following components:   WBC 16.1 (*)    Platelets 532 (*)    All other components within normal limits  LIPASE, BLOOD  URINALYSIS, ROUTINE W REFLEX MICROSCOPIC  POC URINE PREG, ED   Radiology CT Renal Stone Study  Result Date: 02/24/2023 CLINICAL DATA:  37 year old female status post kidney stone treatment yesterday. Abdomen and flank pain now. Pain becoming intolerable. EXAM: CT ABDOMEN AND PELVIS WITHOUT CONTRAST TECHNIQUE:  Multidetector CT imaging of the abdomen and pelvis was performed following the standard protocol without IV contrast. RADIATION DOSE REDUCTION: This exam was performed according to the departmental dose-optimization program which includes automated exposure control, adjustment of the mA and/or kV according to patient size and/or use of iterative reconstruction technique. COMPARISON:  CT Abdomen and Pelvis 12/29/2022. FINDINGS: Lower chest: Minimal left costophrenic angle atelectasis or scarring. Otherwise negative. Hepatobiliary: Chronic cholecystectomy.  Negative noncontrast liver. Pancreas: Negative. Spleen: Negative. Adrenals/Urinary Tract: Normal adrenal glands. Negative noncontrast left kidney and left ureter. Right double-J ureteral stent now in place superimposed on right lower pole nephrolithiasis with stones individually up to 9 mm. Small volume of gas in the right renal pelvis which is likely procedure related on series 2, image 38. The right renal pelvis size has not significantly changed since July, mildly capacious or dilated. Right ureteral stent tracks appropriately into the bladder. Distal pigtail is in the bladder. Trace gas within the bladder. No stone identified within the ureter adjacent to the stent. No hydroureter. No stone identified in the bladder. Stomach/Bowel: No dilated large or small bowel. Normal appendix on. Coronal image 37 just caudal to the cecum. Mild to moderate residual fluid in the stomach. No free air, free fluid, or mesenteric inflammation identified. Vascular/Lymphatic: Normal caliber abdominal aorta. Vascular patency is not evaluated in the absence of IV contrast. No calcified atherosclerosis or lymphadenopathy identified. Reproductive: IUD with no adverse features. Other: No pelvis free fluid. Musculoskeletal: Negative. IMPRESSION: 1. Right double-J ureteral stent now in place with no adverse features. Unchanged right nephrolithiasis and chronically mildly dilated or  capacious right renal pelvis. 2. No other acute or inflammatory process identified in the noncontrast abdomen or pelvis. Electronically Signed   By: Odessa Fleming M.D.   On: 02/24/2023 06:58   Procedures Procedures    Medications Ordered in ED Medications - No data to display  ED Course/ Medical Decision Making/ A&P                                 Medical Decision Making Amount and/or Complexity of Data Reviewed Labs: ordered. Radiology: ordered.  Risk Prescription drug management.   Right flank pain consistent with renal colic in setting of recent placement of ureteral stent.  I am concerned about possible malfunctioning stent.  I have ordered IV fluids, hydromorphone for pain, ondansetron for nausea.  I have ordered renal stone protocol CT scan to evaluate stent placement.  I have ordered laboratory workup with CBC, comprehensive metabolic panel, lipase, urinalysis.  I have reviewed her past records, and note operative note yesterday for cystoscopy and right ureteral stent placement.  She had inadequate relief of pain with hydromorphone and I ordered a second dose of hydromorphone with adequate relief.  Unfortunately, she did develop worsening nausea in spite  of ondansetron.  I have ordered a dose of prochlorperazine.  I have reviewed her laboratory tests and my interpretation is elevated random glucose which will need to be followed as an outpatient, moderate leukocytosis and thrombocytosis which are felt to be reactive.  CT scan shows stent in appropriate location and appears to be functioning adequately, unchanged location of right nephrolithiasis.  I have independently viewed the images, and agree with the radiologist's interpretation.  I have discussed the case with Dr. Ronne Binning of urology who request the patient be sent to his office for removal of stent.  Final Clinical Impression(s) / ED Diagnoses Final diagnoses:  Right flank pain  Elevated random blood glucose level   Thrombocytosis    Rx / DC Orders ED Discharge Orders     None         Dione Booze, MD 02/24/23 [phone]

## 2023-02-24 NOTE — ED Notes (Signed)
Patient transported to CT 

## 2023-02-24 NOTE — ED Triage Notes (Signed)
Pt c/o right flank pain after procedure yesterday to have kidney stones removed. Unable to remove kidney stones and placed stent for the meantime hoping they could go back in and remove them at later time. Pt states that she is not able to tolerate the pain and that it has increased since she was d/c. Pt takes oxy at home with no relief.

## 2023-02-27 ENCOUNTER — Telehealth: Payer: Self-pay | Admitting: Urology

## 2023-02-27 NOTE — Telephone Encounter (Signed)
Patient is wanting to know when she will start feeling better, she is still  having a lot of pain and being up all night with it, what should she be looking for ?  At what point should she be concerned with something being wrong?

## 2023-02-27 NOTE — Telephone Encounter (Signed)
Patient is aware a message will be sent to Sarah on recommendation. Patient voiced understanding  Patient states she had a procedure on Thursday that was unsuccessful. Patient states she had to have stent remove due to be in execrating pain. Patient states she is concern and want to know if this is normal and should she be worried about anything.

## 2023-02-28 ENCOUNTER — Encounter: Payer: Self-pay | Admitting: Urology

## 2023-02-28 NOTE — Telephone Encounter (Signed)
Patient called back she said someone left a message with her husband to call back?

## 2023-02-28 NOTE — Patient Instructions (Signed)

## 2023-02-28 NOTE — Telephone Encounter (Signed)
I returned he call and informed her that our office had not reached out, it could have been [place] stone or [facility] for her PAT prior to litho on 10/01.  Patient wanted to follow up on her previous message about if it was normal to have pain after having her stent removed. She states she is doing much better today and is having little pain.  I informed her that discomfort can be normal, to check for signs of fever and to call our office back if her pain worsens or she is unable to void.  Patient voiced understanding.  She will call back if she does not hear from [facility] or [facility] by Thursday.

## 2023-03-01 ENCOUNTER — Encounter: Payer: BC Managed Care – PPO | Admitting: Urology

## 2023-03-03 ENCOUNTER — Encounter (HOSPITAL_COMMUNITY)
Admission: RE | Admit: 2023-03-03 | Discharge: 2023-03-03 | Disposition: A | Discharge status: Home / Self Care | Payer: BC Managed Care – PPO | Source: Ambulatory Visit | Attending: Urology

## 2023-03-03 ENCOUNTER — Encounter (HOSPITAL_COMMUNITY): Payer: Self-pay

## 2023-03-03 DIAGNOSIS — N200 Calculus of kidney: Secondary | ICD-10-CM

## 2023-03-07 ENCOUNTER — Encounter (HOSPITAL_COMMUNITY)
Admission: RE | Disposition: A | Discharge status: Home / Self Care | Payer: Self-pay | Source: Home / Self Care | Attending: Urology

## 2023-03-07 ENCOUNTER — Ambulatory Visit (HOSPITAL_COMMUNITY): Payer: BC Managed Care – PPO

## 2023-03-07 ENCOUNTER — Ambulatory Visit (HOSPITAL_COMMUNITY)
Admission: RE | Admit: 2023-03-07 | Discharge: 2023-03-07 | Disposition: A | Discharge status: Home / Self Care | Payer: BC Managed Care – PPO | Attending: Urology | Admitting: Urology

## 2023-03-07 ENCOUNTER — Encounter (HOSPITAL_COMMUNITY): Payer: BC Managed Care – PPO

## 2023-03-07 ENCOUNTER — Encounter (HOSPITAL_COMMUNITY): Payer: Self-pay

## 2023-03-07 DIAGNOSIS — N200 Calculus of kidney: Secondary | ICD-10-CM | POA: Insufficient documentation

## 2023-03-07 DIAGNOSIS — N201 Calculus of ureter: Secondary | ICD-10-CM

## 2023-03-07 DIAGNOSIS — R109 Unspecified abdominal pain: Secondary | ICD-10-CM

## 2023-03-07 HISTORY — PX: EXTRACORPOREAL SHOCK WAVE LITHOTRIPSY: SHX1557

## 2023-03-07 LAB — POCT PREGNANCY, URINE: Preg Test, Ur: NEGATIVE

## 2023-03-07 SURGERY — EXTRACORPOREAL SHOCK WAVE LITHOTRIPSY (ESWL)
Anesthesia: LOCAL | Laterality: Right

## 2023-03-07 MED ORDER — DIPHENHYDRAMINE HCL 25 MG PO CAPS
25.0000 mg | ORAL_CAPSULE | ORAL | Status: AC
  Administered 2023-03-07: 25 mg via ORAL
  Filled 2023-03-07: qty 1

## 2023-03-07 MED ORDER — TAMSULOSIN HCL 0.4 MG PO CAPS: 0.4000 mg | ORAL_CAPSULE | Freq: Every day | ORAL | 0 refills | Status: DC

## 2023-03-07 MED ORDER — ONDANSETRON HCL 4 MG PO TABS: 4.0000 mg | ORAL_TABLET | Freq: Three times a day (TID) | ORAL | 0 refills | Status: AC | PRN

## 2023-03-07 MED ORDER — DIAZEPAM 5 MG PO TABS
10.0000 mg | ORAL_TABLET | Freq: Once | ORAL | Status: AC
  Administered 2023-03-07: 10 mg via ORAL
  Filled 2023-03-07: qty 2

## 2023-03-07 MED ORDER — OXYCODONE-ACETAMINOPHEN 5-325 MG PO TABS: 1.0000 | ORAL_TABLET | ORAL | 0 refills | Status: DC | PRN

## 2023-03-07 MED ORDER — OXYCODONE HCL 5 MG PO TABS
ORAL_TABLET | ORAL | Status: AC
  Filled 2023-03-07: qty 1

## 2023-03-07 MED ORDER — OXYCODONE HCL 5 MG PO TABS
5.0000 mg | ORAL_TABLET | Freq: Once | ORAL | Status: AC
  Administered 2023-03-07: 5 mg via ORAL

## 2023-03-07 NOTE — Interval H&P Note (Signed)
History and Physical Interval Note:  03/07/2023 7:55 AM  Lindsay Andrade  has presented today for surgery, with the diagnosis of right nephrolithiasis.  The various methods of treatment have been discussed with the patient and family. After consideration of risks, benefits and other options for treatment, the patient has consented to  Procedure(s): EXTRACORPOREAL SHOCK WAVE LITHOTRIPSY (ESWL) (Right) as a surgical intervention.  The patient's history has been reviewed, patient examined, no change in status, stable for surgery.  I have reviewed the patient's chart and labs.  Questions were answered to the patient's satisfaction.     Wilkie Aye

## 2023-03-08 ENCOUNTER — Encounter (HOSPITAL_COMMUNITY): Payer: Self-pay | Admitting: Urology

## 2023-03-14 NOTE — Progress Notes (Deleted)
Name: Lindsay Andrade DOB: 05/16/1986 MRN: [id number]  Diagnoses: 1) Post-operative state  Recent history:  > 12/06/2022: Underwent right ESWL procedure by Dr. Ronne Binning for management of a right distal ureteral stone.   > 12/23/2022: Postop check. Still symptomatic for stone.  > 02/23/2023: Underwent right ureteroscopic stone manipulation with ureteral stent placement by Dr. Ronne Binning. "Findings: Malrotated right kidney with moderate right hydronephrosis to the UPJ. Narrowing of the UPJ with inability to pass the flexible ureteroscope into the renal pelvis."   > 02/24/2023: Postop visit; cystoscopy with right ureteral stent removal by Dr. Ronne Binning.   > 03/07/2023: Underwent right ESWL procedure by Dr. Ronne Binning, which had to be concluded early due to significant patient pain.  ***if stones still present on KUB & patient still symptomatic, consult with Dr. Ronne Binning re: next steps. Does she need right pyeloplasty due to 02/24/23 CT finding of "right lower pole nephrolithiasis with stones individually up to 9 mm" and surgical finding on 02/23/23 of malrotated right kidney with proximal ureteral stricture / stenosis with moderate right hydronephrosis?***  Today: KUB today: Awaiting radiology read; ***  Today She reports ***  She {Actions; denies-reports:120008} flank pain or abdominal pain. She {Actions; denies-reports:120008} fevers, nausea, or vomiting.  She {Actions; denies-reports:120008} increased urinary urgency, frequency, nocturia, dysuria, gross hematuria, hesitancy, straining to void, or sensations of incomplete emptying.   Fall Screening: Do you usually have a device to assist in your mobility? {yes/no:20286} ***cane / ***walker / ***wheelchair   Medications: Current Outpatient Medications  Medication Sig Dispense Refill   buPROPion (WELLBUTRIN XL) 300 MG 24 hr tablet Take 300 mg by mouth daily.     ibuprofen (ADVIL) 200 MG tablet Take 800 mg by mouth every 6 (six) hours as needed  for headache or moderate pain. (Patient not taking: Reported on 09/09/2022)     ketorolac (TORADOL) 10 MG tablet Take 1 tablet (10 mg total) by mouth every 8 (eight) hours as needed. 30 tablet 0   nitrofurantoin, macrocrystal-monohydrate, (MACROBID) 100 MG capsule Take 1 capsule (100 mg total) by mouth every 12 (twelve) hours. 14 capsule 0   ondansetron (ZOFRAN) 4 MG tablet Take 1 tablet (4 mg total) by mouth every 8 (eight) hours as needed for nausea or vomiting. 30 tablet 0   oxyCODONE-acetaminophen (PERCOCET) 5-325 MG tablet Take 1 tablet by mouth every 4 (four) hours as needed. 30 tablet 0   oxymetazoline (AFRIN) 0.05 % nasal spray Place 1 spray into both nostrils 2 (two) times daily as needed for congestion.     pantoprazole (PROTONIX) 40 MG tablet Take 40 mg by mouth daily.     sertraline (ZOLOFT) 25 MG tablet Take 25 mg by mouth daily.     tamsulosin (FLOMAX) 0.4 MG CAPS capsule Take 1 capsule (0.4 mg total) by mouth at bedtime. 30 capsule 0   Current Facility-Administered Medications  Medication Dose Route Frequency Provider Last Rate Last Admin   ciprofloxacin (CIPRO) tablet 500 mg  500 mg Oral Once McKenzie, Mardene Celeste, MD        Allergies: No Known Allergies  Past Medical History:  Diagnosis Date   Anxiety    Asthma    hx of    Headache    History of kidney stones    Past Surgical History:  Procedure Laterality Date   CHOLECYSTECTOMY N/A 04/10/2020   Procedure: LAPAROSCOPIC CHOLECYSTECTOMY WITH INTRAOPERATIVE CHOLANGIOGRAM;  Surgeon: Luretha Murphy, MD;  Location: [facility];  Service: General;  Laterality: N/A;   CYSTOSCOPY WITH RETROGRADE PYELOGRAM,  URETEROSCOPY AND STENT PLACEMENT Right 02/23/2023   Procedure: CYSTOSCOPY WITH RETROGRADE PYELOGRAM, DIAGNOSTIC URETEROSCOPY AND STENT PLACEMENT;  Surgeon: Malen Gauze, MD;  Location: [facility];  Service: Urology;  Laterality: Right;  pt can't come earlier, has to put daughter on school bus   EXTRACORPOREAL SHOCK WAVE  LITHOTRIPSY Right 12/06/2022   Procedure: EXTRACORPOREAL SHOCK WAVE LITHOTRIPSY (ESWL);  Surgeon: Malen Gauze, MD;  Location: [facility];  Service: Urology;  Laterality: Right;   EXTRACORPOREAL SHOCK WAVE LITHOTRIPSY Right 03/07/2023   Procedure: EXTRACORPOREAL SHOCK WAVE LITHOTRIPSY (ESWL);  Surgeon: Malen Gauze, MD;  Location: [facility];  Service: Urology;  Laterality: Right;   No family history on file. Social History   Socioeconomic History   Marital status: Married    Spouse name: Not on file   Number of children: 2   Years of education: 12   Highest education level: Not on file  Occupational History   Not on file  Tobacco Use   Smoking status: Former   Smokeless tobacco: Never  Vaping Use   Vaping status: Former   Substances: Nicotine  Substance and Sexual Activity   Alcohol use: Never   Drug use: Never   Sexual activity: Yes  Other Topics Concern   Not on file  Social History Narrative   Not on file   Social Determinants of Health   Financial Resource Strain: Not on file  Food Insecurity: Not on file  Transportation Needs: Not on file  Physical Activity: Not on file  Stress: Not on file  Social Connections: Unknown (02/16/2023)   [facility]   Social Network    Social Network: Not on file  Intimate Partner Violence: Unknown (02/16/2023)   Received from [facility]   HITS    Physically Hurt: Not on file    Insult or Talk Down To: Not on file    Threaten Physical Harm: Not on file    Scream or Curse: Not on file    SUBJECTIVE  Review of Systems*** Constitutional: Patient denies any unintentional weight loss or change in strength lntegumentary: Patient denies any rashes or pruritus Eyes: Patient denies ***dry eyes ENT: Patient ***denies dry mouth Cardiovascular: Patient denies chest pain or syncope Respiratory: Patient denies shortness of breath Gastrointestinal: Patient ***denies nausea, vomiting, constipation, or  diarrhea Musculoskeletal: Patient denies muscle cramps or weakness Neurologic: Patient denies convulsions or seizures Allergic/Immunologic: Patient denies recent allergic reaction(s) Hematologic/Lymphatic: Patient denies bleeding tendencies Endocrine: Patient denies heat/cold intolerance  GU: As per HPI.  OBJECTIVE There were no vitals filed for this visit. There is no height or weight on file to calculate BMI.  Physical Examination*** Constitutional: No obvious distress; patient is non-toxic appearing  Cardiovascular: No visible lower extremity edema.  Respiratory: The patient does not have audible wheezing/stridor; respirations do not appear labored  Gastrointestinal: Abdomen non-distended Musculoskeletal: Normal ROM of UEs  Skin: No obvious rashes/open sores  Neurologic: CN 2-12 grossly intact Psychiatric: Answered questions appropriately with normal affect  Hematologic/Lymphatic/Immunologic: No obvious bruises or sites of spontaneous bleeding  UA: ***negative *** WBC/hpf, *** RBC/hpf, bacteria (***) PVR: *** ml  ASSESSMENT Nephrolithiasis  Postop check  We reviewed the operative procedures and findings. She is doing ***well. Pre-operative symptoms are *** since the procedure. We reviewed recent imaging results; ***awaiting radiology results, appears to have ***no acute findings.  ***For stone prevention: Advised adequate hydration and we discussed option to consider low oxalate diet given that calcium oxalate is the most common type of  stone. Handout provided about stone prevention diet.  ***For recurrent stone formers: We discussed option to proceed with 24 hour urinalysis ([facility]) for metabolic evaluation, which may help with targeted recommendations for dietary I medication therapies for stone prevention. Patient elected to ***proceed/ ***hold off.  Will plan to follow up in ***6 months / ***1 year with KUB ***RUS for stone surveillance or sooner if needed.  Pt  verbalized understanding and agreement. All questions were answered.  PLAN Advised the following: ***Maintain adequate fluid intake. ***Low oxalate diet. No follow-ups on file.  *** Billing: 90 day global period for ESWL ***  No orders of the defined types were placed in this encounter.   It has been explained that the patient is to follow regularly with their PCP in addition to all other providers involved in their care and to follow instructions provided by these respective offices. Patient advised to contact urology clinic if any urologic-pertaining questions, concerns, new symptoms or problems arise in the interim period.  There are no Patient Instructions on file for this visit.  Electronically signed by:  Donnita Falls, MSN, FNP-C, CUNP 03/14/2023 1:52 PM

## 2023-03-21 ENCOUNTER — Encounter: Payer: BC Managed Care – PPO | Admitting: Urology

## 2023-03-21 DIAGNOSIS — Z09 Encounter for follow-up examination after completed treatment for conditions other than malignant neoplasm: Secondary | ICD-10-CM

## 2023-03-21 DIAGNOSIS — N135 Crossing vessel and stricture of ureter without hydronephrosis: Secondary | ICD-10-CM

## 2023-03-21 DIAGNOSIS — N1330 Unspecified hydronephrosis: Secondary | ICD-10-CM

## 2023-03-21 DIAGNOSIS — N200 Calculus of kidney: Secondary | ICD-10-CM

## 2023-03-23 ENCOUNTER — Encounter: Payer: Self-pay | Admitting: Urology

## 2023-03-23 ENCOUNTER — Ambulatory Visit (INDEPENDENT_AMBULATORY_CARE_PROVIDER_SITE_OTHER): Payer: BC Managed Care – PPO | Admitting: Urology

## 2023-03-23 ENCOUNTER — Ambulatory Visit (HOSPITAL_COMMUNITY)
Admission: RE | Admit: 2023-03-23 | Discharge: 2023-03-23 | Disposition: A | Discharge status: Home / Self Care | Payer: BC Managed Care – PPO | Source: Ambulatory Visit | Attending: Urology | Admitting: Urology

## 2023-03-23 VITALS — BP 119/74 | HR 97 | Temp 97.8°F

## 2023-03-23 DIAGNOSIS — R399 Unspecified symptoms and signs involving the genitourinary system: Secondary | ICD-10-CM

## 2023-03-23 DIAGNOSIS — N200 Calculus of kidney: Secondary | ICD-10-CM

## 2023-03-23 DIAGNOSIS — N2889 Other specified disorders of kidney and ureter: Secondary | ICD-10-CM | POA: Diagnosis not present

## 2023-03-23 DIAGNOSIS — N201 Calculus of ureter: Secondary | ICD-10-CM

## 2023-03-23 DIAGNOSIS — R8290 Unspecified abnormal findings in urine: Secondary | ICD-10-CM

## 2023-03-23 DIAGNOSIS — Z09 Encounter for follow-up examination after completed treatment for conditions other than malignant neoplasm: Secondary | ICD-10-CM

## 2023-03-23 DIAGNOSIS — R109 Unspecified abdominal pain: Secondary | ICD-10-CM

## 2023-03-23 MED ORDER — TAMSULOSIN HCL 0.4 MG PO CAPS
0.4000 mg | ORAL_CAPSULE | Freq: Every day | ORAL | 0 refills | Status: DC
Start: 2023-03-23 — End: 2023-04-25

## 2023-03-23 MED ORDER — NITROFURANTOIN MONOHYD MACRO 100 MG PO CAPS
100.0000 mg | ORAL_CAPSULE | Freq: Two times a day (BID) | ORAL | 0 refills | Status: AC
Start: 2023-03-23 — End: 2023-03-30

## 2023-03-23 NOTE — Progress Notes (Signed)
Name: Lindsay Andrade DOB: 1985-12-22 MRN: [id number]  Diagnoses: 1) Post-operative state  Recent history:  > 12/06/2022: Underwent right ESWL procedure by Dr. Ronne Binning for management of a right distal ureteral stone.   > 12/23/2022: Postop check. Still symptomatic for stone.  > 02/23/2023: Underwent right ureteroscopic stone manipulation with ureteral stent placement by Dr. Ronne Binning. "Findings: Malrotated right kidney with moderate right hydronephrosis to the UPJ. Narrowing of the UPJ with inability to pass the flexible ureteroscope into the renal pelvis."  > 02/24/2023: Postop visit; cystoscopy with right ureteral stent removal by Dr. Ronne Binning.   > 03/07/2023: Underwent right ESWL procedure by Dr. Ronne Binning, which had to be concluded early due to significant patient pain.   Today: KUB not performed prior to visit today.  She reports mild intermittent right flank pain, bladder pain / pressure, weak urinary stream, increased urinary urgency, hesitancy, and sometimes straining to void.  She denies increased urinary frequency, dysuria, gross hematuria, abdominal pain, fevers, nausea, or vomiting.   Fall Screening: Do you usually have a device to assist in your mobility? No   Medications: Current Outpatient Medications  Medication Sig Dispense Refill   nitrofurantoin, macrocrystal-monohydrate, (MACROBID) 100 MG capsule Take 1 capsule (100 mg total) by mouth 2 (two) times daily for 7 days. 14 capsule 0   buPROPion (WELLBUTRIN XL) 300 MG 24 hr tablet Take 300 mg by mouth daily.     ibuprofen (ADVIL) 200 MG tablet Take 800 mg by mouth every 6 (six) hours as needed for headache or moderate pain. (Patient not taking: Reported on 09/09/2022)     ketorolac (TORADOL) 10 MG tablet Take 1 tablet (10 mg total) by mouth every 8 (eight) hours as needed. 30 tablet 0   ondansetron (ZOFRAN) 4 MG tablet Take 1 tablet (4 mg total) by mouth every 8 (eight) hours as needed for nausea or vomiting. 30 tablet 0    oxyCODONE-acetaminophen (PERCOCET) 5-325 MG tablet Take 1 tablet by mouth every 4 (four) hours as needed. 30 tablet 0   oxymetazoline (AFRIN) 0.05 % nasal spray Place 1 spray into both nostrils 2 (two) times daily as needed for congestion.     pantoprazole (PROTONIX) 40 MG tablet Take 40 mg by mouth daily.     sertraline (ZOLOFT) 25 MG tablet Take 25 mg by mouth daily.     tamsulosin (FLOMAX) 0.4 MG CAPS capsule Take 1 capsule (0.4 mg total) by mouth at bedtime. 30 capsule 0   Current Facility-Administered Medications  Medication Dose Route Frequency Provider Last Rate Last Admin   ciprofloxacin (CIPRO) tablet 500 mg  500 mg Oral Once McKenzie, Mardene Celeste, MD        Allergies: No Known Allergies  Past Medical History:  Diagnosis Date   Anxiety    Asthma    hx of    Headache    History of kidney stones    Past Surgical History:  Procedure Laterality Date   CHOLECYSTECTOMY N/A 04/10/2020   Procedure: LAPAROSCOPIC CHOLECYSTECTOMY WITH INTRAOPERATIVE CHOLANGIOGRAM;  Surgeon: Luretha Murphy, MD;  Location: [facility];  Service: General;  Laterality: N/A;   CYSTOSCOPY WITH RETROGRADE PYELOGRAM, URETEROSCOPY AND STENT PLACEMENT Right 02/23/2023   Procedure: CYSTOSCOPY WITH RETROGRADE PYELOGRAM, DIAGNOSTIC URETEROSCOPY AND STENT PLACEMENT;  Surgeon: Malen Gauze, MD;  Location: [facility];  Service: Urology;  Laterality: Right;  pt can't come earlier, has to put daughter on school bus   EXTRACORPOREAL SHOCK WAVE LITHOTRIPSY Right 12/06/2022   Procedure: EXTRACORPOREAL SHOCK WAVE LITHOTRIPSY (ESWL);  Surgeon: Malen Gauze, MD;  Location: [facility];  Service: Urology;  Laterality: Right;   EXTRACORPOREAL SHOCK WAVE LITHOTRIPSY Right 03/07/2023   Procedure: EXTRACORPOREAL SHOCK WAVE LITHOTRIPSY (ESWL);  Surgeon: Malen Gauze, MD;  Location: [facility];  Service: Urology;  Laterality: Right;   No family history on file. Social History   Socioeconomic History   Marital status: Married     Spouse name: Not on file   Number of children: 2   Years of education: 12   Highest education level: Not on file  Occupational History   Not on file  Tobacco Use   Smoking status: Former   Smokeless tobacco: Never  Vaping Use   Vaping status: Former   Substances: Nicotine  Substance and Sexual Activity   Alcohol use: Never   Drug use: Never   Sexual activity: Yes  Other Topics Concern   Not on file  Social History Narrative   Not on file   Social Determinants of Health   Financial Resource Strain: Not on file  Food Insecurity: Not on file  Transportation Needs: Not on file  Physical Activity: Not on file  Stress: Not on file  Social Connections: Unknown (02/16/2023)   Received from [facility]   Social Network    Social Network: Not on file  Intimate Partner Violence: Unknown (02/16/2023)   Received from [facility]   HITS    Physically Hurt: Not on file    Insult or Talk Down To: Not on file    Threaten Physical Harm: Not on file    Scream or Curse: Not on file    SUBJECTIVE  Review of Systems Constitutional: Patient denies any unintentional weight loss or change in strength lntegumentary: Patient denies any rashes or pruritus Cardiovascular: Patient denies chest pain or syncope Respiratory: Patient denies shortness of breath Gastrointestinal: Patient denies nausea, vomiting, constipation, or diarrhea Musculoskeletal: Patient denies muscle cramps or weakness Neurologic: Patient denies convulsions or seizures Allergic/Immunologic: Patient denies recent allergic reaction(s) Hematologic/Lymphatic: Patient denies bleeding tendencies Endocrine: Patient denies heat/cold intolerance  GU: As per HPI.  OBJECTIVE Vitals:   03/23/23 1529  BP: 119/74  Pulse: 97  Temp: 97.8 F (36.6 C)   There is no height or weight on file to calculate BMI.  Physical Examination Constitutional: No obvious distress; patient is non-toxic appearing  Cardiovascular: No visible  lower extremity edema.  Respiratory: The patient does not have audible wheezing/stridor; respirations do not appear labored  Gastrointestinal: Abdomen non-distended Musculoskeletal: Normal ROM of UEs  Skin: No obvious rashes/open sores  Neurologic: CN 2-12 grossly intact Psychiatric: Answered questions appropriately with normal affect  Hematologic/Lymphatic/Immunologic: No obvious bruises or sites of spontaneous bleeding  UA: 11-30 WBC/hpf, 3-10 RBC/hpf, bacteria (moderate)  ASSESSMENT UTI symptoms - Plan: nitrofurantoin, macrocrystal-monohydrate, (MACROBID) 100 MG capsule, Urine Culture  Kidney stones - Plan: Urinalysis, Routine w reflex microscopic, nitrofurantoin, macrocrystal-monohydrate, (MACROBID) 100 MG capsule, tamsulosin (FLOMAX) 0.4 MG CAPS capsule, Calculi, with Photograph (to Clinical Lab)  Abnormal urinalysis - Plan: nitrofurantoin, macrocrystal-monohydrate, (MACROBID) 100 MG capsule  Postop check  Right ureteral stone - Plan: tamsulosin (FLOMAX) 0.4 MG CAPS capsule  Right flank pain - Plan: tamsulosin (FLOMAX) 0.4 MG CAPS capsule  We reviewed the operative procedures and findings. Advised patient to get KUB today to reassess stone burden. Will plan to consult Dr. Ronne Binning to determine next steps and notify patient of his recommendations. She was advised to continue Flomax daily at this time for medical expulsive therapy; refill sent. Will  send stone fragments for compositional analysis.  Abnormal UA with UTI symptoms present. Will check urine culture and treat empirically with Nitrofurantoin while awaiting culture results and sensitivities.  Follow up to be determined as per Dr. Dimas Millin recommendations. Pt verbalized understanding and agreement. All questions were answered.  PLAN Advised the following: 1. KUB today. 2. Urine culture. 3. Macrobid 100 mg 2x/day for 7 days. 4. Continue Flomax 0.4 mg daily. 5. Stone analysis.   Orders Placed This Encounter   Procedures   Urine Culture   Urinalysis, Routine w reflex microscopic   Calculi, with Photograph (to Clinical Lab)   It has been explained that the patient is to follow regularly with their PCP in addition to all other providers involved in their care and to follow instructions provided by these respective offices. Patient advised to contact urology clinic if any urologic-pertaining questions, concerns, new symptoms or problems arise in the interim period.  There are no Patient Instructions on file for this visit.  Electronically signed by:  Donnita Falls, MSN, FNP-C, CUNP 03/23/2023 4:10 PM

## 2023-03-24 ENCOUNTER — Encounter: Payer: Self-pay | Admitting: Urology

## 2023-03-24 ENCOUNTER — Other Ambulatory Visit: Payer: Self-pay | Admitting: Urology

## 2023-03-24 DIAGNOSIS — N201 Calculus of ureter: Secondary | ICD-10-CM

## 2023-03-24 DIAGNOSIS — R109 Unspecified abdominal pain: Secondary | ICD-10-CM

## 2023-03-24 DIAGNOSIS — N1330 Unspecified hydronephrosis: Secondary | ICD-10-CM

## 2023-03-24 DIAGNOSIS — N200 Calculus of kidney: Secondary | ICD-10-CM

## 2023-03-24 LAB — URINALYSIS, ROUTINE W REFLEX MICROSCOPIC
Bilirubin, UA: NEGATIVE
Glucose, UA: NEGATIVE
Ketones, UA: NEGATIVE
Nitrite, UA: NEGATIVE
Protein,UA: NEGATIVE
Specific Gravity, UA: 1.03 (ref 1.005–1.030)
Urobilinogen, Ur: 0.2 mg/dL (ref 0.2–1.0)
pH, UA: 6 (ref 5.0–7.5)

## 2023-03-24 LAB — MICROSCOPIC EXAMINATION: Epithelial Cells (non renal): >10 /[HPF] — AB (ref 0–10)

## 2023-03-24 NOTE — Progress Notes (Signed)
Consulted with Dr. Ronne Binning today and spoke with patient via phone about his recommendations. Per review of yesterday's KUB images she has stone fragments remaining in distal right ureter. Advised to continue Flomax 0.4 mg daily for medical expulsive therapy then follow up in 2 weeks with KUB for recheck. Dr. Ronne Binning said the right kidney malrotation will be addressed after resolution of obstructing ureteral stones. Pt verbalized understanding and agreement. All questions were answered.  Evette Georges, MSN, FNP-C, [facility] Nurse Practitioner [facility] [place]

## 2023-03-25 LAB — URINE CULTURE

## 2023-03-27 ENCOUNTER — Telehealth: Payer: Self-pay

## 2023-03-27 NOTE — Telephone Encounter (Signed)
-----   Message from Donnita Falls sent at 03/24/2023 11:40 AM EDT ----- Please schedule with Dr. Ronne Binning in 2 weeks for complex stone follow up & possible surgical consult with KUB prior.

## 2023-03-27 NOTE — Telephone Encounter (Signed)
Patient called and made aware of appointment with Dr. Ronne Binning.  For November 11 at 9:40. Patient voiced understanding.

## 2023-03-31 LAB — CALCULI, WITH PHOTOGRAPH (CLINICAL LAB)
Calcium Oxalate Dihydrate: 40 %
Calcium Oxalate Monohydrate: 50 %
Hydroxyapatite: 10 %
Weight Calculi: 9 mg

## 2023-04-03 NOTE — Progress Notes (Signed)
Letter sent.

## 2023-04-06 ENCOUNTER — Ambulatory Visit (HOSPITAL_COMMUNITY)
Admission: RE | Admit: 2023-04-06 | Discharge: 2023-04-06 | Disposition: A | Discharge status: Home / Self Care | Payer: BC Managed Care – PPO | Source: Ambulatory Visit | Attending: Urology | Admitting: Urology

## 2023-04-06 DIAGNOSIS — R109 Unspecified abdominal pain: Secondary | ICD-10-CM | POA: Diagnosis not present

## 2023-04-06 DIAGNOSIS — N200 Calculus of kidney: Secondary | ICD-10-CM | POA: Diagnosis not present

## 2023-04-06 DIAGNOSIS — N201 Calculus of ureter: Secondary | ICD-10-CM | POA: Insufficient documentation

## 2023-04-06 DIAGNOSIS — N1330 Unspecified hydronephrosis: Secondary | ICD-10-CM | POA: Diagnosis not present

## 2023-04-10 ENCOUNTER — Telehealth: Payer: Self-pay

## 2023-04-10 NOTE — Telephone Encounter (Signed)
-----   Message from Donnita Falls sent at 04/10/2023  8:40 AM EST ----- Please let pt know the RUS showed a 9 mm right kidney stone; no hydronephrosis on either side. Follow up as planned with Dr. Ronne Binning.

## 2023-04-10 NOTE — Telephone Encounter (Signed)
Patient is aware of NP's response and will follow up as scheduled with MD.

## 2023-04-17 ENCOUNTER — Encounter: Payer: Self-pay | Admitting: Urology

## 2023-04-17 ENCOUNTER — Telehealth: Payer: Self-pay

## 2023-04-17 ENCOUNTER — Ambulatory Visit: Payer: BC Managed Care – PPO | Admitting: Urology

## 2023-04-17 VITALS — BP 119/76 | HR 92

## 2023-04-17 DIAGNOSIS — N200 Calculus of kidney: Secondary | ICD-10-CM | POA: Diagnosis not present

## 2023-04-17 LAB — URINALYSIS, ROUTINE W REFLEX MICROSCOPIC
Bilirubin, UA: NEGATIVE
Glucose, UA: NEGATIVE
Ketones, UA: NEGATIVE
Nitrite, UA: NEGATIVE
Protein,UA: NEGATIVE
RBC, UA: NEGATIVE
Specific Gravity, UA: 1.025 (ref 1.005–1.030)
Urobilinogen, Ur: 0.2 mg/dL (ref 0.2–1.0)
pH, UA: 6 (ref 5.0–7.5)

## 2023-04-17 LAB — MICROSCOPIC EXAMINATION

## 2023-04-17 NOTE — Telephone Encounter (Signed)
Patient had an appointment today. Forgot to ask Dr. Ronne Binning a question. Asking for a call back today, if possible.

## 2023-04-17 NOTE — H&P (View-Only) (Signed)
 04/17/2023 10:22 AM   Lindsay Andrade 11/10/1985 [id number]  Referring provider: Benita Stabile, MD [address] [place],  [place] [address]  Followup nephrolithiasis   HPI: Lindsay Andrade is a 37yo here for followup for nephrolithiasis. She has passed a couple of fragments. Renal US shows right lower pole calculus. She denies any flank pain   PMH: Past Medical History:  Diagnosis Date   Anxiety    Asthma    hx of    Headache    History of kidney stones     Surgical History: Past Surgical History:  Procedure Laterality Date   CHOLECYSTECTOMY N/A 04/10/2020   Procedure: LAPAROSCOPIC CHOLECYSTECTOMY WITH INTRAOPERATIVE CHOLANGIOGRAM;  Surgeon: Luretha Murphy, MD;  Location: [facility];  Service: General;  Laterality: N/A;   CYSTOSCOPY WITH RETROGRADE PYELOGRAM, URETEROSCOPY AND STENT PLACEMENT Right 02/23/2023   Procedure: CYSTOSCOPY WITH RETROGRADE PYELOGRAM, DIAGNOSTIC URETEROSCOPY AND STENT PLACEMENT;  Surgeon: Malen Gauze, MD;  Location: [facility];  Service: Urology;  Laterality: Right;  pt can't come earlier, has to put daughter on school bus   EXTRACORPOREAL SHOCK WAVE LITHOTRIPSY Right 12/06/2022   Procedure: EXTRACORPOREAL SHOCK WAVE LITHOTRIPSY (ESWL);  Surgeon: Malen Gauze, MD;  Location: [facility];  Service: Urology;  Laterality: Right;   EXTRACORPOREAL SHOCK WAVE LITHOTRIPSY Right 03/07/2023   Procedure: EXTRACORPOREAL SHOCK WAVE LITHOTRIPSY (ESWL);  Surgeon: Malen Gauze, MD;  Location: [facility];  Service: Urology;  Laterality: Right;    Home Medications:  Allergies as of 04/17/2023   No Known Allergies      Medication List        Accurate as of April 17, 2023 10:22 AM. If you have any questions, ask your nurse or doctor.          buPROPion 300 MG 24 hr tablet Commonly known as: WELLBUTRIN XL Take 300 mg by mouth daily.   ibuprofen 200 MG tablet Commonly known as: ADVIL Take 800 mg by mouth every 6 (six) hours as needed for headache or  moderate pain.   ketorolac 10 MG tablet Commonly known as: TORADOL Take 1 tablet (10 mg total) by mouth every 8 (eight) hours as needed.   ondansetron 4 MG tablet Commonly known as: Zofran Take 1 tablet (4 mg total) by mouth every 8 (eight) hours as needed for nausea or vomiting.   oxyCODONE-acetaminophen 5-325 MG tablet Commonly known as: Percocet Take 1 tablet by mouth every 4 (four) hours as needed.   oxymetazoline 0.05 % nasal spray Commonly known as: AFRIN Place 1 spray into both nostrils 2 (two) times daily as needed for congestion.   pantoprazole 40 MG tablet Commonly known as: PROTONIX Take 40 mg by mouth daily.   sertraline 25 MG tablet Commonly known as: ZOLOFT Take 25 mg by mouth daily.   tamsulosin 0.4 MG Caps capsule Commonly known as: FLOMAX Take 1 capsule (0.4 mg total) by mouth at bedtime.        Allergies: No Known Allergies  Family History: No family history on file.  Social History:  reports that she has quit smoking. She has never used smokeless tobacco. She reports that she does not drink alcohol and does not use drugs.  ROS: All other review of systems were reviewed and are negative except what is noted above in HPI  Physical Exam: BP 119/76   Pulse 92   Constitutional:  Alert and oriented, No acute distress. HEENT: [place] AT, moist mucus membranes.  Trachea midline, no masses. Cardiovascular: No  clubbing, cyanosis, or edema. Respiratory: Normal respiratory effort, no increased work of breathing. GI: Abdomen is soft, nontender, nondistended, no abdominal masses GU: No CVA tenderness.  Lymph: No cervical or inguinal lymphadenopathy. Skin: No rashes, bruises or suspicious lesions. Neurologic: Grossly intact, no focal deficits, moving all 4 extremities. Psychiatric: Normal mood and affect.  Laboratory Data: Lab Results  Component Value Date   WBC 16.1 (H) 02/24/2023   HGB 13.8 02/24/2023   HCT 39.5 02/24/2023   MCV 90.4 02/24/2023   PLT  532 (H) 02/24/2023    Lab Results  Component Value Date   CREATININE 0.67 02/24/2023    No results found for: "PSA"  No results found for: "TESTOSTERONE"  No results found for: "HGBA1C"  Urinalysis    Component Value Date/Time   COLORURINE YELLOW 02/24/2023 0934   APPEARANCEUR Clear 03/23/2023 1531   LABSPEC 1.023 02/24/2023 0934   PHURINE 6.0 02/24/2023 0934   GLUCOSEU Negative 03/23/2023 1531   HGBUR LARGE (A) 02/24/2023 0934   BILIRUBINUR Negative 03/23/2023 1531   KETONESUR 5 (A) 02/24/2023 0934   PROTEINUR Negative 03/23/2023 1531   PROTEINUR 100 (A) 02/24/2023 0934   NITRITE Negative 03/23/2023 1531   NITRITE NEGATIVE 02/24/2023 0934   LEUKOCYTESUR 2+ (A) 03/23/2023 1531   LEUKOCYTESUR SMALL (A) 02/24/2023 0934    Lab Results  Component Value Date   LABMICR See below: 03/23/2023   WBCUA 11-30 (A) 03/23/2023   LABEPIT >10 (A) 03/23/2023   BACTERIA Moderate (A) 03/23/2023    Pertinent Imaging: Renal US and KUB 10/31: Images reviewed and discussed with the patient  Results for orders placed during the hospital encounter of 03/23/23  DG Abd 1 View  Narrative CLINICAL DATA:  Kidney stones.  EXAM: ABDOMEN - 1 VIEW  COMPARISON:  03/07/2023  FINDINGS: The bowel gas pattern is normal. Three 3-4 mm calcifications overlie the right kidney. An IUD is noted.  IMPRESSION: Right-sided nephrolithiasis.   Electronically Signed By: Layla Maw M.D. On: 03/25/2023 16:49  No results found for this or any previous visit.  No results found for this or any previous visit.  No results found for this or any previous visit.  Results for orders placed during the hospital encounter of 04/06/23  US RENAL  Narrative CLINICAL DATA:  Possible renal stone  EXAM: RENAL / URINARY TRACT ULTRASOUND COMPLETE  COMPARISON:  Renal stone CT 02/24/2023  FINDINGS: Right Kidney:  Renal measurements: 12.3 x 3.8 x 4.0 cm = volume: 98.8 mL. Echogenicity within  normal limits. No mass or hydronephrosis visualized. There is a 9 mm stone.  Left Kidney:  Renal measurements: 13.7 x 5.5 x 5.2 cm = volume: 203.4 mL. Echogenicity within normal limits. No mass or hydronephrosis visualized.  Bladder:  Appears normal for degree of bladder distention.  Other:  None.  IMPRESSION: 1. No hydronephrosis. 2. Right nephrolithiasis.   Electronically Signed By: Annia Belt M.D. On: 04/08/2023 13:42  No valid procedures specified. No results found for this or any previous visit.  Results for orders placed during the hospital encounter of 02/24/23  CT Renal Stone Study  Narrative CLINICAL DATA:  37 year old female status post kidney stone treatment yesterday. Abdomen and flank pain now. Pain becoming intolerable.  EXAM: CT ABDOMEN AND PELVIS WITHOUT CONTRAST  TECHNIQUE: Multidetector CT imaging of the abdomen and pelvis was performed following the standard protocol without IV contrast.  RADIATION DOSE REDUCTION: This exam was performed according to the departmental dose-optimization program which includes automated exposure control, adjustment of  the mA and/or kV according to patient size and/or use of iterative reconstruction technique.  COMPARISON:  CT Abdomen and Pelvis 12/29/2022.  FINDINGS: Lower chest: Minimal left costophrenic angle atelectasis or scarring. Otherwise negative.  Hepatobiliary: Chronic cholecystectomy.  Negative noncontrast liver.  Pancreas: Negative.  Spleen: Negative.  Adrenals/Urinary Tract: Normal adrenal glands.  Negative noncontrast left kidney and left ureter.  Right double-J ureteral stent now in place superimposed on right lower pole nephrolithiasis with stones individually up to 9 mm. Small volume of gas in the right renal pelvis which is likely procedure related on series 2, image 38. The right renal pelvis size has not significantly changed since July, mildly capacious or dilated. Right  ureteral stent tracks appropriately into the bladder. Distal pigtail is in the bladder. Trace gas within the bladder. No stone identified within the ureter adjacent to the stent. No hydroureter. No stone identified in the bladder.  Stomach/Bowel: No dilated large or small bowel. Normal appendix on. Coronal image 37 just caudal to the cecum. Mild to moderate residual fluid in the stomach. No free air, free fluid, or mesenteric inflammation identified.  Vascular/Lymphatic: Normal caliber abdominal aorta. Vascular patency is not evaluated in the absence of IV contrast. No calcified atherosclerosis or lymphadenopathy identified.  Reproductive: IUD with no adverse features.  Other: No pelvis free fluid.  Musculoskeletal: Negative.  IMPRESSION: 1. Right double-J ureteral stent now in place with no adverse features. Unchanged right nephrolithiasis and chronically mildly dilated or capacious right renal pelvis.  2. No other acute or inflammatory process identified in the noncontrast abdomen or pelvis.   Electronically Signed By: Odessa Fleming M.D. On: 02/24/2023 06:58   Assessment & Plan:    1. Kidney stones -We discussed the management of kidney stones. These options include observation, ureteroscopy, shockwave lithotripsy (ESWL) and percutaneous nephrolithotomy (PCNL). We discussed which options are relevant to the patient's stone(s). We discussed the natural history of kidney stones as well as the complications of untreated stones and the impact on quality of life without treatment as well as with each of the above listed treatments. We also discussed the efficacy of each treatment in its ability to clear the stone burden. With any of these management options I discussed the signs and symptoms of infection and the need for emergent treatment should these be experienced. For each option we discussed the ability of each procedure to clear the patient of their stone burden.   For observation  I described the risks which include but are not limited to silent renal damage, life-threatening infection, need for emergent surgery, failure to pass stone and pain.   For ureteroscopy I described the risks which include bleeding, infection, damage to contiguous structures, positioning injury, ureteral stricture, ureteral avulsion, ureteral injury, need for prolonged ureteral stent, inability to perform ureteroscopy, need for an interval procedure, inability to clear stone burden, stent discomfort/pain, heart attack, stroke, pulmonary embolus and the inherent risks with general anesthesia.   For shockwave lithotripsy I described the risks which include arrhythmia, kidney contusion, kidney hemorrhage, need for transfusion, pain, inability to adequately break up stone, inability to pass stone fragments, Steinstrasse, infection associated with obstructing stones, need for alternate surgical procedure, need for repeat shockwave lithotripsy, MI, CVA, PE and the inherent risks with anesthesia/conscious sedation.   For PCNL I described the risks including positioning injury, pneumothorax, hydrothorax, need for chest tube, inability to clear stone burden, renal laceration, arterial venous fistula or malformation, need for embolization of kidney, loss of kidney or  renal function, need for repeat procedure, need for prolonged nephrostomy tube, ureteral avulsion, MI, CVA, PE and the inherent risks of general anesthesia.   - The patient would like to proceed with right ESWL - Urinalysis, Routine w reflex microscopic   No follow-ups on file.  Wilkie Aye, MD  [facility] [place]

## 2023-04-17 NOTE — Patient Instructions (Signed)

## 2023-04-17 NOTE — Progress Notes (Unsigned)
04/17/2023 10:22 AM   Lindsay Andrade 11/10/1985 [id number]  Referring provider: Benita Stabile, MD [address] [place],  [place] [address]  Followup nephrolithiasis   HPI: Ms Strength is a 37yo here for followup for nephrolithiasis. She has passed a couple of fragments. Renal US shows right lower pole calculus. She denies any flank pain   PMH: Past Medical History:  Diagnosis Date   Anxiety    Asthma    hx of    Headache    History of kidney stones     Surgical History: Past Surgical History:  Procedure Laterality Date   CHOLECYSTECTOMY N/A 04/10/2020   Procedure: LAPAROSCOPIC CHOLECYSTECTOMY WITH INTRAOPERATIVE CHOLANGIOGRAM;  Surgeon: Luretha Murphy, MD;  Location: [facility];  Service: General;  Laterality: N/A;   CYSTOSCOPY WITH RETROGRADE PYELOGRAM, URETEROSCOPY AND STENT PLACEMENT Right 02/23/2023   Procedure: CYSTOSCOPY WITH RETROGRADE PYELOGRAM, DIAGNOSTIC URETEROSCOPY AND STENT PLACEMENT;  Surgeon: Malen Gauze, MD;  Location: [facility];  Service: Urology;  Laterality: Right;  pt can't come earlier, has to put daughter on school bus   EXTRACORPOREAL SHOCK WAVE LITHOTRIPSY Right 12/06/2022   Procedure: EXTRACORPOREAL SHOCK WAVE LITHOTRIPSY (ESWL);  Surgeon: Malen Gauze, MD;  Location: [facility];  Service: Urology;  Laterality: Right;   EXTRACORPOREAL SHOCK WAVE LITHOTRIPSY Right 03/07/2023   Procedure: EXTRACORPOREAL SHOCK WAVE LITHOTRIPSY (ESWL);  Surgeon: Malen Gauze, MD;  Location: [facility];  Service: Urology;  Laterality: Right;    Home Medications:  Allergies as of 04/17/2023   No Known Allergies      Medication List        Accurate as of April 17, 2023 10:22 AM. If you have any questions, ask your nurse or doctor.          buPROPion 300 MG 24 hr tablet Commonly known as: WELLBUTRIN XL Take 300 mg by mouth daily.   ibuprofen 200 MG tablet Commonly known as: ADVIL Take 800 mg by mouth every 6 (six) hours as needed for headache or  moderate pain.   ketorolac 10 MG tablet Commonly known as: TORADOL Take 1 tablet (10 mg total) by mouth every 8 (eight) hours as needed.   ondansetron 4 MG tablet Commonly known as: Zofran Take 1 tablet (4 mg total) by mouth every 8 (eight) hours as needed for nausea or vomiting.   oxyCODONE-acetaminophen 5-325 MG tablet Commonly known as: Percocet Take 1 tablet by mouth every 4 (four) hours as needed.   oxymetazoline 0.05 % nasal spray Commonly known as: AFRIN Place 1 spray into both nostrils 2 (two) times daily as needed for congestion.   pantoprazole 40 MG tablet Commonly known as: PROTONIX Take 40 mg by mouth daily.   sertraline 25 MG tablet Commonly known as: ZOLOFT Take 25 mg by mouth daily.   tamsulosin 0.4 MG Caps capsule Commonly known as: FLOMAX Take 1 capsule (0.4 mg total) by mouth at bedtime.        Allergies: No Known Allergies  Family History: No family history on file.  Social History:  reports that she has quit smoking. She has never used smokeless tobacco. She reports that she does not drink alcohol and does not use drugs.  ROS: All other review of systems were reviewed and are negative except what is noted above in HPI  Physical Exam: BP 119/76   Pulse 92   Constitutional:  Alert and oriented, No acute distress. HEENT: [place] AT, moist mucus membranes.  Trachea midline, no masses. Cardiovascular: No  clubbing, cyanosis, or edema. Respiratory: Normal respiratory effort, no increased work of breathing. GI: Abdomen is soft, nontender, nondistended, no abdominal masses GU: No CVA tenderness.  Lymph: No cervical or inguinal lymphadenopathy. Skin: No rashes, bruises or suspicious lesions. Neurologic: Grossly intact, no focal deficits, moving all 4 extremities. Psychiatric: Normal mood and affect.  Laboratory Data: Lab Results  Component Value Date   WBC 16.1 (H) 02/24/2023   HGB 13.8 02/24/2023   HCT 39.5 02/24/2023   MCV 90.4 02/24/2023   PLT  532 (H) 02/24/2023    Lab Results  Component Value Date   CREATININE 0.67 02/24/2023    No results found for: "PSA"  No results found for: "TESTOSTERONE"  No results found for: "HGBA1C"  Urinalysis    Component Value Date/Time   COLORURINE YELLOW 02/24/2023 0934   APPEARANCEUR Clear 03/23/2023 1531   LABSPEC 1.023 02/24/2023 0934   PHURINE 6.0 02/24/2023 0934   GLUCOSEU Negative 03/23/2023 1531   HGBUR LARGE (A) 02/24/2023 0934   BILIRUBINUR Negative 03/23/2023 1531   KETONESUR 5 (A) 02/24/2023 0934   PROTEINUR Negative 03/23/2023 1531   PROTEINUR 100 (A) 02/24/2023 0934   NITRITE Negative 03/23/2023 1531   NITRITE NEGATIVE 02/24/2023 0934   LEUKOCYTESUR 2+ (A) 03/23/2023 1531   LEUKOCYTESUR SMALL (A) 02/24/2023 0934    Lab Results  Component Value Date   LABMICR See below: 03/23/2023   WBCUA 11-30 (A) 03/23/2023   LABEPIT >10 (A) 03/23/2023   BACTERIA Moderate (A) 03/23/2023    Pertinent Imaging: Renal US and KUB 10/31: Images reviewed and discussed with the patient  Results for orders placed during the hospital encounter of 03/23/23  DG Abd 1 View  Narrative CLINICAL DATA:  Kidney stones.  EXAM: ABDOMEN - 1 VIEW  COMPARISON:  03/07/2023  FINDINGS: The bowel gas pattern is normal. Three 3-4 mm calcifications overlie the right kidney. An IUD is noted.  IMPRESSION: Right-sided nephrolithiasis.   Electronically Signed By: Layla Maw M.D. On: 03/25/2023 16:49  No results found for this or any previous visit.  No results found for this or any previous visit.  No results found for this or any previous visit.  Results for orders placed during the hospital encounter of 04/06/23  US RENAL  Narrative CLINICAL DATA:  Possible renal stone  EXAM: RENAL / URINARY TRACT ULTRASOUND COMPLETE  COMPARISON:  Renal stone CT 02/24/2023  FINDINGS: Right Kidney:  Renal measurements: 12.3 x 3.8 x 4.0 cm = volume: 98.8 mL. Echogenicity within  normal limits. No mass or hydronephrosis visualized. There is a 9 mm stone.  Left Kidney:  Renal measurements: 13.7 x 5.5 x 5.2 cm = volume: 203.4 mL. Echogenicity within normal limits. No mass or hydronephrosis visualized.  Bladder:  Appears normal for degree of bladder distention.  Other:  None.  IMPRESSION: 1. No hydronephrosis. 2. Right nephrolithiasis.   Electronically Signed By: Annia Belt M.D. On: 04/08/2023 13:42  No valid procedures specified. No results found for this or any previous visit.  Results for orders placed during the hospital encounter of 02/24/23  CT Renal Stone Study  Narrative CLINICAL DATA:  37 year old female status post kidney stone treatment yesterday. Abdomen and flank pain now. Pain becoming intolerable.  EXAM: CT ABDOMEN AND PELVIS WITHOUT CONTRAST  TECHNIQUE: Multidetector CT imaging of the abdomen and pelvis was performed following the standard protocol without IV contrast.  RADIATION DOSE REDUCTION: This exam was performed according to the departmental dose-optimization program which includes automated exposure control, adjustment of  the mA and/or kV according to patient size and/or use of iterative reconstruction technique.  COMPARISON:  CT Abdomen and Pelvis 12/29/2022.  FINDINGS: Lower chest: Minimal left costophrenic angle atelectasis or scarring. Otherwise negative.  Hepatobiliary: Chronic cholecystectomy.  Negative noncontrast liver.  Pancreas: Negative.  Spleen: Negative.  Adrenals/Urinary Tract: Normal adrenal glands.  Negative noncontrast left kidney and left ureter.  Right double-J ureteral stent now in place superimposed on right lower pole nephrolithiasis with stones individually up to 9 mm. Small volume of gas in the right renal pelvis which is likely procedure related on series 2, image 38. The right renal pelvis size has not significantly changed since July, mildly capacious or dilated. Right  ureteral stent tracks appropriately into the bladder. Distal pigtail is in the bladder. Trace gas within the bladder. No stone identified within the ureter adjacent to the stent. No hydroureter. No stone identified in the bladder.  Stomach/Bowel: No dilated large or small bowel. Normal appendix on. Coronal image 37 just caudal to the cecum. Mild to moderate residual fluid in the stomach. No free air, free fluid, or mesenteric inflammation identified.  Vascular/Lymphatic: Normal caliber abdominal aorta. Vascular patency is not evaluated in the absence of IV contrast. No calcified atherosclerosis or lymphadenopathy identified.  Reproductive: IUD with no adverse features.  Other: No pelvis free fluid.  Musculoskeletal: Negative.  IMPRESSION: 1. Right double-J ureteral stent now in place with no adverse features. Unchanged right nephrolithiasis and chronically mildly dilated or capacious right renal pelvis.  2. No other acute or inflammatory process identified in the noncontrast abdomen or pelvis.   Electronically Signed By: Odessa Fleming M.D. On: 02/24/2023 06:58   Assessment & Plan:    1. Kidney stones -We discussed the management of kidney stones. These options include observation, ureteroscopy, shockwave lithotripsy (ESWL) and percutaneous nephrolithotomy (PCNL). We discussed which options are relevant to the patient's stone(s). We discussed the natural history of kidney stones as well as the complications of untreated stones and the impact on quality of life without treatment as well as with each of the above listed treatments. We also discussed the efficacy of each treatment in its ability to clear the stone burden. With any of these management options I discussed the signs and symptoms of infection and the need for emergent treatment should these be experienced. For each option we discussed the ability of each procedure to clear the patient of their stone burden.   For observation  I described the risks which include but are not limited to silent renal damage, life-threatening infection, need for emergent surgery, failure to pass stone and pain.   For ureteroscopy I described the risks which include bleeding, infection, damage to contiguous structures, positioning injury, ureteral stricture, ureteral avulsion, ureteral injury, need for prolonged ureteral stent, inability to perform ureteroscopy, need for an interval procedure, inability to clear stone burden, stent discomfort/pain, heart attack, stroke, pulmonary embolus and the inherent risks with general anesthesia.   For shockwave lithotripsy I described the risks which include arrhythmia, kidney contusion, kidney hemorrhage, need for transfusion, pain, inability to adequately break up stone, inability to pass stone fragments, Steinstrasse, infection associated with obstructing stones, need for alternate surgical procedure, need for repeat shockwave lithotripsy, MI, CVA, PE and the inherent risks with anesthesia/conscious sedation.   For PCNL I described the risks including positioning injury, pneumothorax, hydrothorax, need for chest tube, inability to clear stone burden, renal laceration, arterial venous fistula or malformation, need for embolization of kidney, loss of kidney or  renal function, need for repeat procedure, need for prolonged nephrostomy tube, ureteral avulsion, MI, CVA, PE and the inherent risks of general anesthesia.   - The patient would like to proceed with right ESWL - Urinalysis, Routine w reflex microscopic   No follow-ups on file.  Wilkie Aye, MD  [facility] [place]

## 2023-04-18 NOTE — Telephone Encounter (Signed)
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  If so, will need to be before end of year.  Insurance will cover.

## 2023-04-19 NOTE — Telephone Encounter (Signed)
Verbal from Dr. Ronne Binning does not need treatment.  Patient made aware and no further questions at this time.

## 2023-04-21 ENCOUNTER — Encounter (HOSPITAL_COMMUNITY)
Admission: RE | Admit: 2023-04-21 | Discharge: 2023-04-21 | Disposition: A | Discharge status: Home / Self Care | Payer: BC Managed Care – PPO | Source: Ambulatory Visit | Attending: Urology

## 2023-04-21 ENCOUNTER — Encounter (HOSPITAL_COMMUNITY): Payer: Self-pay

## 2023-04-25 ENCOUNTER — Ambulatory Visit (HOSPITAL_COMMUNITY): Payer: BC Managed Care – PPO

## 2023-04-25 ENCOUNTER — Encounter (HOSPITAL_COMMUNITY)
Admission: RE | Disposition: A | Discharge status: Home / Self Care | Payer: Self-pay | Source: Home / Self Care | Attending: Urology

## 2023-04-25 ENCOUNTER — Ambulatory Visit (HOSPITAL_COMMUNITY)
Admission: RE | Admit: 2023-04-25 | Discharge: 2023-04-25 | Disposition: A | Discharge status: Home / Self Care | Payer: BC Managed Care – PPO | Attending: Urology | Admitting: Urology

## 2023-04-25 DIAGNOSIS — N200 Calculus of kidney: Secondary | ICD-10-CM | POA: Diagnosis not present

## 2023-04-25 DIAGNOSIS — Z01818 Encounter for other preprocedural examination: Secondary | ICD-10-CM

## 2023-04-25 DIAGNOSIS — Z9049 Acquired absence of other specified parts of digestive tract: Secondary | ICD-10-CM | POA: Diagnosis not present

## 2023-04-25 DIAGNOSIS — R109 Unspecified abdominal pain: Secondary | ICD-10-CM

## 2023-04-25 DIAGNOSIS — N201 Calculus of ureter: Secondary | ICD-10-CM

## 2023-04-25 HISTORY — PX: EXTRACORPOREAL SHOCK WAVE LITHOTRIPSY: SHX1557

## 2023-04-25 LAB — POCT PREGNANCY, URINE: Preg Test, Ur: NEGATIVE

## 2023-04-25 SURGERY — EXTRACORPOREAL SHOCK WAVE LITHOTRIPSY (ESWL)
Anesthesia: LOCAL | Laterality: Right

## 2023-04-25 MED ORDER — DIAZEPAM 5 MG PO TABS
10.0000 mg | ORAL_TABLET | Freq: Once | ORAL | Status: AC
  Administered 2023-04-25: 10 mg via ORAL
  Filled 2023-04-25: qty 2

## 2023-04-25 MED ORDER — ONDANSETRON HCL 4 MG PO TABS: 4.0000 mg | ORAL_TABLET | Freq: Every day | ORAL | 1 refills | Status: AC | PRN

## 2023-04-25 MED ORDER — OXYCODONE-ACETAMINOPHEN 5-325 MG PO TABS: 1.0000 | ORAL_TABLET | ORAL | 0 refills | Status: AC | PRN

## 2023-04-25 MED ORDER — TAMSULOSIN HCL 0.4 MG PO CAPS: 0.4000 mg | ORAL_CAPSULE | Freq: Every day | ORAL | 0 refills | Status: AC

## 2023-04-25 MED ORDER — SODIUM CHLORIDE 0.9 % IV SOLN: INTRAVENOUS | Status: DC

## 2023-04-25 MED ORDER — DIPHENHYDRAMINE HCL 25 MG PO CAPS
25.0000 mg | ORAL_CAPSULE | ORAL | Status: AC
  Administered 2023-04-25: 25 mg via ORAL
  Filled 2023-04-25: qty 1

## 2023-04-25 NOTE — Interval H&P Note (Signed)
History and Physical Interval Note:  04/25/2023 8:17 AM  Lindsay Andrade  has presented today for surgery, with the diagnosis of Right Nephrolithiasis.  The various methods of treatment have been discussed with the patient and family. After consideration of risks, benefits and other options for treatment, the patient has consented to  Procedure(s): EXTRACORPOREAL SHOCK WAVE LITHOTRIPSY (ESWL) (Right) as a surgical intervention.  The patient's history has been reviewed, patient examined, no change in status, stable for surgery.  I have reviewed the patient's chart and labs.  Questions were answered to the patient's satisfaction.     Wilkie Aye

## 2023-04-25 NOTE — Progress Notes (Signed)
Reddened area over treated side.

## 2023-04-27 ENCOUNTER — Encounter (HOSPITAL_COMMUNITY): Payer: Self-pay | Admitting: Urology

## 2023-05-02 NOTE — Progress Notes (Signed)
Name: Lindsay Andrade DOB: [birth year] MRN: [id number]  Diagnoses: 1) Post-operative state  HPI: Lindsay Andrade presents post-operatively s/p right ESWL procedure on 04/25/2023 by Dr. Ronne Binning for management of a right intrarenal stone.  Postop course: KUB today: Awaiting radiology read; no stones appreciated.  Today She reports that she had right flank pain yesterday which seems to have radiated to her RLQ which lasted for several hours; had to take one of her pain pills due to the pain severity. Today the pain is still present but mild. Denies fevers, nausea, or vomiting.  She denies increased urinary urgency, frequency, nocturia, dysuria, gross hematuria, hesitancy, straining to void, or sensations of incomplete emptying.   Fall Screening: Do you usually have a device to assist in your mobility? No   Medications: Current Outpatient Medications  Medication Sig Dispense Refill   buPROPion (WELLBUTRIN XL) 300 MG 24 hr tablet Take 300 mg by mouth daily.     ibuprofen (ADVIL) 200 MG tablet Take 800 mg by mouth every 6 (six) hours as needed for headache or moderate pain (pain score 4-6).     ketorolac (TORADOL) 10 MG tablet Take 1 tablet (10 mg total) by mouth every 8 (eight) hours as needed. 30 tablet 0   ondansetron (ZOFRAN) 4 MG tablet Take 1 tablet (4 mg total) by mouth every 8 (eight) hours as needed for nausea or vomiting. 30 tablet 0   ondansetron (ZOFRAN) 4 MG tablet Take 1 tablet (4 mg total) by mouth daily as needed for nausea or vomiting. 30 tablet 1   oxyCODONE-acetaminophen (PERCOCET) 5-325 MG tablet Take 1 tablet by mouth every 4 (four) hours as needed. 30 tablet 0   oxymetazoline (AFRIN) 0.05 % nasal spray Place 1 spray into both nostrils 2 (two) times daily as needed for congestion.     pantoprazole (PROTONIX) 40 MG tablet Take 40 mg by mouth daily.     sertraline (ZOLOFT) 25 MG tablet Take 25 mg by mouth daily.     tamsulosin (FLOMAX) 0.4 MG CAPS capsule Take 1 capsule (0.4 mg  total) by mouth at bedtime. 30 capsule 0   Current Facility-Administered Medications  Medication Dose Route Frequency Provider Last Rate Last Admin   ciprofloxacin (CIPRO) tablet 500 mg  500 mg Oral Once McKenzie, Mardene Celeste, MD        Allergies: No Known Allergies  Past Medical History:  Diagnosis Date   Anxiety    Asthma    hx of    Headache    History of kidney stones    Past Surgical History:  Procedure Laterality Date   CHOLECYSTECTOMY N/A 04/10/2020   Procedure: LAPAROSCOPIC CHOLECYSTECTOMY WITH INTRAOPERATIVE CHOLANGIOGRAM;  Surgeon: Luretha Murphy, MD;  Location: [facility];  Service: General;  Laterality: N/A;   CYSTOSCOPY WITH RETROGRADE PYELOGRAM, URETEROSCOPY AND STENT PLACEMENT Right 02/23/2023   Procedure: CYSTOSCOPY WITH RETROGRADE PYELOGRAM, DIAGNOSTIC URETEROSCOPY AND STENT PLACEMENT;  Surgeon: Malen Gauze, MD;  Location: [facility];  Service: Urology;  Laterality: Right;  pt can't come earlier, has to put daughter on school bus   EXTRACORPOREAL SHOCK WAVE LITHOTRIPSY Right 12/06/2022   Procedure: EXTRACORPOREAL SHOCK WAVE LITHOTRIPSY (ESWL);  Surgeon: Malen Gauze, MD;  Location: [facility];  Service: Urology;  Laterality: Right;   EXTRACORPOREAL SHOCK WAVE LITHOTRIPSY Right 03/07/2023   Procedure: EXTRACORPOREAL SHOCK WAVE LITHOTRIPSY (ESWL);  Surgeon: Malen Gauze, MD;  Location: [facility];  Service: Urology;  Laterality: Right;   EXTRACORPOREAL SHOCK WAVE LITHOTRIPSY Right 04/25/2023   Procedure:  EXTRACORPOREAL SHOCK WAVE LITHOTRIPSY (ESWL);  Surgeon: Malen Gauze, MD;  Location: [facility];  Service: Urology;  Laterality: Right;   History reviewed. No pertinent family history. Social History   Socioeconomic History   Marital status: Married    Spouse name: Not on file   Number of children: 2   Years of education: 12   Highest education level: Not on file  Occupational History   Not on file  Tobacco Use   Smoking status: Former   Smokeless  tobacco: Never  Vaping Use   Vaping status: Former   Substances: Nicotine  Substance and Sexual Activity   Alcohol use: Never   Drug use: Never   Sexual activity: Yes  Other Topics Concern   Not on file  Social History Narrative   Not on file   Social Determinants of Health   Financial Resource Strain: Not on file  Food Insecurity: Not on file  Transportation Needs: Not on file  Physical Activity: Not on file  Stress: Not on file  Social Connections: Unknown (02/16/2023)   Received from [facility]   Social Network    Social Network: Not on file  Intimate Partner Violence: Unknown (02/16/2023)   Received from [facility]   HITS    Physically Hurt: Not on file    Insult or Talk Down To: Not on file    Threaten Physical Harm: Not on file    Scream or Curse: Not on file    SUBJECTIVE  Review of Systems Constitutional: Patient denies any unintentional weight loss or change in strength lntegumentary: Patient denies any rashes or pruritus Cardiovascular: Patient denies chest pain or syncope Respiratory: Patient denies shortness of breath Gastrointestinal: Patient denies nausea, vomiting, constipation, or diarrhea Musculoskeletal: Patient denies muscle cramps or weakness Neurologic: Patient denies convulsions or seizures Allergic/Immunologic: Patient denies recent allergic reaction(s) Hematologic/Lymphatic: Patient denies bleeding tendencies Endocrine: Patient denies heat/cold intolerance  GU: As per HPI.  OBJECTIVE Vitals:   05/09/23 1051  BP: 117/75  Pulse: 76  Temp: 98.1 F (36.7 C)   There is no height or weight on file to calculate BMI.  Physical Examination Constitutional: No obvious distress; patient is non-toxic appearing  Cardiovascular: No visible lower extremity edema.  Respiratory: The patient does not have audible wheezing/stridor; respirations do not appear labored  Gastrointestinal: Abdomen non-distended Musculoskeletal: Normal ROM of UEs   Skin: No obvious rashes/open sores  Neurologic: CN 2-12 grossly intact Psychiatric: Answered questions appropriately with normal affect  Hematologic/Lymphatic/Immunologic: No obvious bruises or sites of spontaneous bleeding  UA: >30 WBC/hpf, 3-10 RBC/hpf, many bacteria  ASSESSMENT Nephrolithiasis - Plan: Urinalysis, Routine w reflex microscopic  We reviewed the operative procedures and findings. We reviewed today's imaging results; awaiting radiology results, appears to have no acute findings. Due to her persistent right-sided pain however we discussed option to obtain CT stone study for further evaluation; she elected to hold off on that for now and was advised to notify provider if she would like to proceed with that in the near future if the pain persists / fails to resolve.   UA today appears abnormal however patient is asymptomatic for UTI, therefore acute antibiotic treatment is not advised at this time. The rationale for this was discussed with the patient.  - We discussed the difference between asymptomatic bacteriuria versus symptoms suggestive of a possible UTI which may include: fever, confusion, malaise, increased fatigue, dysuria, acute increase in urinary frequency / urgency / urge incontinence. - We discussed that urine  color, clarity, and odor are not clinical indicators of UTI. Urine testing / cultures are not advised in the absence of acute UTI symptoms given that acute antibiotic treatment would not be indicated regardless of the findings. - Patient was advised that positive urine cultures only warrant acute antibiotic treatment if UTI symptoms are present. - In the absence of active UTI symptoms, a positive urine culture is considered to represent colonization of the urinary tract which does not warrant acute antibiotic treatment. - We discussed antibiotic stewardship and goal to minimize risk for developing antibiotic resistance.  Patient was advised that if/when UTI-like  symptoms occur they can call our office to speak with a nurse, who can place an order for urinalysis (with reflex to urine culture). They may then proceed to a [facility] to provide their urine sample. This is required for evaluation in order for their Urology provider to make informed treatment recommendation(s), which may or may not include an antibiotic prescription.  For stone prevention: Advised adequate hydration and we discussed option to consider low oxalate diet given that calcium oxalate is the most common type of stone. Handout provided about stone prevention diet.  For recurrent stone formers: We discussed option to proceed with 24 hour urinalysis ([facility]) for metabolic evaluation, which may help with targeted recommendations for dietary I medication therapies for stone prevention. Patient elected to proceed.   Will plan to follow up in 3 months for recheck and to discuss [facility] 24 hour urinalysis results, or sooner if needed. Pt verbalized understanding and agreement. All questions were answered.  PLAN Advised the following: Maintain adequate fluid intake. Low oxalate diet. 24 hour urinalysis ([facility]).  Return in about 3 months (around 08/07/2023) for UA, PVR, & f/u with Evette Georges NP.   Orders Placed This Encounter  Procedures   Urinalysis, Routine w reflex microscopic    It has been explained that the patient is to follow regularly with their PCP in addition to all other providers involved in their care and to follow instructions provided by these respective offices. Patient advised to contact urology clinic if any urologic-pertaining questions, concerns, new symptoms or problems arise in the interim period.  Patient Instructions  >80% of stones are calcium oxalate. This type of stones forms when body either isn't clearing oxalate well enough, is making too much oxalate, or too little citrate. This results in oxalate binding to form crystals, which  continue to aggregate and form stones.  Limiting calcium does not help, but limiting oxalate in the diet can help. Increasing citric acid intake may also help.  The following measures may help to prevent the recurrence of stones: Increase water intake to 2-2.5 liters per day May add citrus juice (lemon, lime or orange juice) to water Moderation in dairy foods Decrease in salt content 5. Low Oxalate diet: Oxylates are found in foods like Tomato, Spinach, red wine and chocolate (see additional resources below).  Internet resources for information regarding low oxalate diet: https://kidneystones.yangchunwu.com https://my.VerticalStretch.be  Foods Low in Sodium or Oxalate Foods You Can Eat  Drinks Coffee, fruit and veggie juice (using the recommended veggies), fruit punch  Fruits Apples, apricots (fresh or canned), avocado, bananas, cherries (sweet), cranberries, grapefruit, red or green grapes, lemon and lime juice, melons, nectarines, papayas, peaches, pears, pineapples, oranges, strawberries (fresh), tangerines  Veggies Artichokes, asparagus, bamboo shoots, broccoli, brussels sprouts, cabbage, cauliflower, chayote squash, chicory, corn, cucumbers, endive, lettuce, lima beans, mushrooms, onions, peas, peppers, potatoes, radishes, rutabagas, zucchini  Breads, Cereals, Grains  Egg noodles, rye bread, cooked and dry cereals without nuts or bran, crackers with unsalted tops, white or wild rice  Meat, Meat Replacements, Fish, [employer], fish, poultry, eggs, egg whites, egg replacements  Soup Homemade soup (using the recommended veggies and meat), low-sodium bouillon, low-sodium canned  Desserts Cookies, cakes, ice cream, pudding without chocolate or nuts, candy without chocolate or nuts  Fats and Oils Butter, margarine, cream, oil, salad dressing, mayo  Other Foods Unsalted potato chips or pretzels,  herbs (like garlic, garlic powder, onion powder), lemon juice, salt-free seasoning blends, vinegar  Other Foods Low in Oxalate Foods You Can Eat  Drinks Beer, cola, wine, buttermilk, lemonade or limeade (without added vitamin C), milk  Meat, Meat Replacements, Fish, [employer] meat, ham, bacon, hot dogs, bratwurst, sausage, chicken nuggets, cheddar cheese, canned fish and shellfish  Soup Tomato soup, cheese soup  Other Foods Coconuts, lemon or lime juices, sugar or sweeteners, jellies or jams (from the recommended list)   Moderate-Oxalate Foods Foods to Limit   Drinks Fruit and veggie juices (from the list below), chocolate milk, rice milk, hot cocoa, tea   Fruits Blackberries, blueberries, black currants, cherries (sour), fruit cocktail, mangoes, orange peel, prunes, purple plums   Veggies Baked beans, carrots, celery, green beans, parsnips, summer squash, tomatoes, turnips   Breads, Cereals, Grains White bread, cornbread or cornmeal, white English muffins, saltine or soda crackers, brown rice, vanilla wafers, spaghetti and other noodles, firm tofu, bagels, oatmeal   Meat/meat replacements, fish, poultry Sardines   Desserts Chocolate cake   Fats and Oils Macadamia nuts, pistachio nuts, English walnuts   Other Foods Jams or jellies (made with the fruits above), pepper    High-Oxalate Foods Foods to Avoid Drinks Chocolate drink mixes, soy milk, Ovaltine, instant iced tea, fruit juices of fruits listed below Fruits Apricots (dried), red currants, figs, kiwi, plums, rhubarb Veggies Beans (wax, dried), beets and beet greens, chives, collard greens, eggplant, escarole, dark greens of all kinds, leeks, okra, parsley, rutabagas, spinach, Swiss chard, tomato paste, watercress Breads, Cereals, Grains Amaranth, barley, white corn flour, fried potatoes, fruitcake, grits, soybean products, sweet potatoes, wheat germ and bran, buckwheat flour, All Bran cereal, graham crackers, pretzels, whole wheat  bread Meat/meat replacements, fish, poultry  Dried beans, peanut butter, soy burgers, miso  Desserts Carob, chocolate, marmalades Fats and Oils Nuts (peanuts, almonds, pecans, cashews, hazelnuts), nut butters, sesame seeds, tahini paste Other Foods Poppy seeds   Electronically signed by:  Donnita Falls, MSN, FNP-C, CUNP 05/09/2023 12:34 PM

## 2023-05-09 ENCOUNTER — Ambulatory Visit: Payer: BC Managed Care – PPO | Admitting: Urology

## 2023-05-09 ENCOUNTER — Ambulatory Visit (HOSPITAL_COMMUNITY)
Admission: RE | Admit: 2023-05-09 | Discharge: 2023-05-09 | Disposition: A | Discharge status: Home / Self Care | Payer: BC Managed Care – PPO | Source: Ambulatory Visit | Attending: Urology | Admitting: Urology

## 2023-05-09 ENCOUNTER — Encounter: Payer: Self-pay | Admitting: Urology

## 2023-05-09 ENCOUNTER — Telehealth: Payer: Self-pay

## 2023-05-09 VITALS — BP 117/75 | HR 76 | Temp 98.1°F

## 2023-05-09 DIAGNOSIS — Z09 Encounter for follow-up examination after completed treatment for conditions other than malignant neoplasm: Secondary | ICD-10-CM

## 2023-05-09 DIAGNOSIS — N200 Calculus of kidney: Secondary | ICD-10-CM | POA: Insufficient documentation

## 2023-05-09 DIAGNOSIS — Z87442 Personal history of urinary calculi: Secondary | ICD-10-CM

## 2023-05-09 LAB — URINALYSIS, ROUTINE W REFLEX MICROSCOPIC
Bilirubin, UA: NEGATIVE
Glucose, UA: NEGATIVE
Ketones, UA: NEGATIVE
Nitrite, UA: NEGATIVE
Protein,UA: NEGATIVE
RBC, UA: NEGATIVE
Specific Gravity, UA: 1.025 (ref 1.005–1.030)
Urobilinogen, Ur: 0.2 mg/dL (ref 0.2–1.0)
pH, UA: 6 (ref 5.0–7.5)

## 2023-05-09 LAB — MICROSCOPIC EXAMINATION: WBC, UA: >30 /[HPF] — AB (ref 0–5)

## 2023-05-09 NOTE — Telephone Encounter (Signed)
Patient is made aware to get KUB prior to appointment today. Patient voiced understanding.

## 2023-05-09 NOTE — Patient Instructions (Signed)

## 2023-06-10 ENCOUNTER — Other Ambulatory Visit: Payer: Self-pay | Admitting: Urology

## 2023-06-10 DIAGNOSIS — N200 Calculus of kidney: Secondary | ICD-10-CM | POA: Diagnosis not present

## 2023-06-17 LAB — LITHOLINK 24HR URINE PANEL
Ammonium, Urine: 19 mmol/(24.h) (ref 15–60)
Calcium Oxalate Saturation: 15.04 — ABNORMAL HIGH (ref 6.00–10.00)
Calcium Phosphate Saturation: 3.77 — ABNORMAL HIGH (ref 0.50–2.00)
Calcium, Urine: 143 mg/(24.h) (ref <200)
Calcium/Creatinine Ratio: 151 mg/g{creat} (ref 51–262)
Calcium/Kg Body Weight: 2.1 mg/kg/d (ref <4.0)
Chloride, Urine: 157 mmol/(24.h) (ref 70–250)
Citrate, Urine: 462 mg/(24.h) — ABNORMAL LOW (ref >550)
Creatinine, Urine: 946 mg/(24.h)
Creatinine/Kg Body Weight: 13.9 mg/kg/d (ref 8.7–20.3)
Cystine, Urine, Qualitative: NEGATIVE
Magnesium, Urine: 49 mg/(24.h) (ref 30–120)
Oxalate, Urine: 34 mg/(24.h) (ref 20–40)
Phosphorus, Urine: 851 mg/(24.h) (ref 600–1200)
Potassium, Urine: 36 mmol/(24.h) (ref 20–100)
Protein Catabolic Rate: 0.7 g/kg/d — ABNORMAL LOW (ref 0.8–1.4)
Sodium, Urine: 175 mmol/(24.h) — ABNORMAL HIGH (ref 50–150)
Sulfate, Urine: 20 meq/(24.h) (ref 20–80)
Urea Nitrogen, Urine: 5.38 g/(24.h) — ABNORMAL LOW (ref 6.00–14.00)
Uric Acid Saturation: 0.97 (ref <1.00)
Uric Acid, Urine: 601 mg/(24.h) (ref <750)
Urine Volume (Preserved): 700 mL/(24.h) (ref 500–4000)
pH, 24 hr, Urine: 6.273 — ABNORMAL HIGH (ref 5.800–6.200)

## 2023-06-19 ENCOUNTER — Telehealth: Payer: Self-pay

## 2023-06-19 NOTE — Telephone Encounter (Signed)
Tried calling patient with no answer.

## 2023-06-19 NOTE — Telephone Encounter (Signed)
-----   Message from Lauraine JAYSON Oz sent at 06/19/2023 10:26 AM EST ----- Please let pt know her 24 hr urine test showed mildly elevated urine pH (too basic) and hypocitraturia (not enough citrate in urine).   Based on findings our goal is to increase urine acidity.   Options: 1. Dietary changes including increase citrus fruit intake, decrease animal protein, and decrease sodium intake.  2. Medication: Potassium citrate 2x/day.  If she wants to start the medication please let me know and I will place that order. Thanks!

## 2023-06-22 NOTE — Telephone Encounter (Addendum)
Tried calling patient with no answer. Letter mailed out.

## 2023-08-10 ENCOUNTER — Ambulatory Visit: Payer: BC Managed Care – PPO | Admitting: Urology

## 2023-09-05 NOTE — Progress Notes (Deleted)
 Name: Lindsay Andrade DOB: 1985/10/06 MRN: [id number]  History of Present Illness: Ms. Lindsay Andrade is a 38 y.o. female who presents today for follow up visit at [facility] [place]. GU History includes: 1. Kidney stones. - 03/23/2023: Stone analysis showed 90% calcium oxalate and 10% hydroxyapatite composition. - 04/25/2023: Underwent right ESWL procedure by Dr. Ronne Binning.  At last visit on 05/09/2023: - KUB: "Previous right renal stone is not seen on the current exam. No visualized urolithiasis."  Since last visit: > 06/10/2023: 24 hr urine test showed mildly elevated urine pH and hypocitraturia.   Today: She {Actions; denies-reports:120008} recent stone passage. She {Actions; denies-reports:120008} flank pain or abdominal pain. She {Actions; denies-reports:120008} fevers, nausea, or vomiting.  She {Actions; denies-reports:120008} increased urinary urgency, frequency, nocturia, dysuria, gross hematuria, hesitancy, straining to void, or sensations of incomplete emptying.  ***repeat KUB in 3-6 months (today is mostly to discuss 24 hr urine results, which showed mildly elevated urine pH (too basic) and hypocitraturia (not enough citrate in urine).  - Based on findings our goal is to increase urine acidity.  - Options: 1. Dietary changes including increase citrus fruit intake, decrease animal protein, and decrease sodium intake.  2. Medication: Potassium citrate 2x/day. ***rx?    Medications: Current Outpatient Medications  Medication Sig Dispense Refill   buPROPion (WELLBUTRIN XL) 300 MG 24 hr tablet Take 300 mg by mouth daily.     ibuprofen (ADVIL) 200 MG tablet Take 800 mg by mouth every 6 (six) hours as needed for headache or moderate pain (pain score 4-6).     ketorolac (TORADOL) 10 MG tablet Take 1 tablet (10 mg total) by mouth every 8 (eight) hours as needed. 30 tablet 0   ondansetron (ZOFRAN) 4 MG tablet Take 1 tablet (4 mg total) by mouth every 8 (eight) hours as needed for  nausea or vomiting. 30 tablet 0   ondansetron (ZOFRAN) 4 MG tablet Take 1 tablet (4 mg total) by mouth daily as needed for nausea or vomiting. 30 tablet 1   oxyCODONE-acetaminophen (PERCOCET) 5-325 MG tablet Take 1 tablet by mouth every 4 (four) hours as needed. 30 tablet 0   oxymetazoline (AFRIN) 0.05 % nasal spray Place 1 spray into both nostrils 2 (two) times daily as needed for congestion.     pantoprazole (PROTONIX) 40 MG tablet Take 40 mg by mouth daily.     sertraline (ZOLOFT) 25 MG tablet Take 25 mg by mouth daily.     tamsulosin (FLOMAX) 0.4 MG CAPS capsule Take 1 capsule (0.4 mg total) by mouth at bedtime. 30 capsule 0   Current Facility-Administered Medications  Medication Dose Route Frequency Provider Last Rate Last Admin   ciprofloxacin (CIPRO) tablet 500 mg  500 mg Oral Once McKenzie, Mardene Celeste, MD        Allergies: No Known Allergies  Past Medical History:  Diagnosis Date   Anxiety    Asthma    hx of    Headache    History of kidney stones    Past Surgical History:  Procedure Laterality Date   CHOLECYSTECTOMY N/A 04/10/2020   Procedure: LAPAROSCOPIC CHOLECYSTECTOMY WITH INTRAOPERATIVE CHOLANGIOGRAM;  Surgeon: Luretha Murphy, MD;  Location: [facility];  Service: General;  Laterality: N/A;   CYSTOSCOPY WITH RETROGRADE PYELOGRAM, URETEROSCOPY AND STENT PLACEMENT Right 02/23/2023   Procedure: CYSTOSCOPY WITH RETROGRADE PYELOGRAM, DIAGNOSTIC URETEROSCOPY AND STENT PLACEMENT;  Surgeon: Malen Gauze, MD;  Location: [facility];  Service: Urology;  Laterality: Right;  pt can't come earlier, has to put  daughter on school bus   EXTRACORPOREAL SHOCK WAVE LITHOTRIPSY Right 12/06/2022   Procedure: EXTRACORPOREAL SHOCK WAVE LITHOTRIPSY (ESWL);  Surgeon: Malen Gauze, MD;  Location: [facility];  Service: Urology;  Laterality: Right;   EXTRACORPOREAL SHOCK WAVE LITHOTRIPSY Right 03/07/2023   Procedure: EXTRACORPOREAL SHOCK WAVE LITHOTRIPSY (ESWL);  Surgeon: Malen Gauze, MD;   Location: [facility];  Service: Urology;  Laterality: Right;   EXTRACORPOREAL SHOCK WAVE LITHOTRIPSY Right 04/25/2023   Procedure: EXTRACORPOREAL SHOCK WAVE LITHOTRIPSY (ESWL);  Surgeon: Malen Gauze, MD;  Location: [facility];  Service: Urology;  Laterality: Right;   No family history on file. Social History   Socioeconomic History   Marital status: Married    Spouse name: Not on file   Number of children: 2   Years of education: 12   Highest education level: Not on file  Occupational History   Not on file  Tobacco Use   Smoking status: Former   Smokeless tobacco: Never  Vaping Use   Vaping status: Former   Substances: Nicotine  Substance and Sexual Activity   Alcohol use: Never   Drug use: Never   Sexual activity: Yes  Other Topics Concern   Not on file  Social History Narrative   Not on file   Social Drivers of Health   Financial Resource Strain: Not on file  Food Insecurity: Not on file  Transportation Needs: Not on file  Physical Activity: Not on file  Stress: Not on file  Social Connections: Unknown (02/16/2023)   [facility]   Social Network    Social Network: Not on file  Intimate Partner Violence: Unknown (02/16/2023)   Received from [facility]   HITS    Physically Hurt: Not on file    Insult or Talk Down To: Not on file    Threaten Physical Harm: Not on file    Scream or Curse: Not on file    SUBJECTIVE  Review of Systems Constitutional: Patient denies any unintentional weight loss or change in strength lntegumentary: Patient denies any rashes or pruritus Cardiovascular: Patient denies chest pain or syncope Respiratory: Patient denies shortness of breath Gastrointestinal: ***Patient {Actions; denies-reports:120008} ***nausea, ***vomiting, ***constipation, ***diarrhea ***As per HPI Musculoskeletal: Patient denies muscle cramps or weakness Neurologic: Patient denies convulsions or seizures Allergic/Immunologic: Patient denies  recent allergic reaction(s) Hematologic/Lymphatic: Patient denies bleeding tendencies Endocrine: Patient denies heat/cold intolerance  GU: As per HPI.  OBJECTIVE There were no vitals filed for this visit. There is no height or weight on file to calculate BMI.  Physical Examination Constitutional: No obvious distress; patient is non-toxic appearing  Cardiovascular: No visible lower extremity edema.  Respiratory: The patient does not have audible wheezing/stridor; respirations do not appear labored  Gastrointestinal: Abdomen non-distended Musculoskeletal: Normal ROM of UEs  Skin: No obvious rashes/open sores  Neurologic: CN 2-12 grossly intact Psychiatric: Answered questions appropriately with normal affect  Hematologic/Lymphatic/Immunologic: No obvious bruises or sites of spontaneous bleeding  UA: ***negative ***positive for *** leukocytes, *** blood, ***nitrites Urine microscopy: *** WBC/hpf, *** RBC/hpf, *** bacteria ***otherwise unremarkable ***glucosuria (secondary to ***Jardiance ***Farxiga use)  PVR: *** ml  ASSESSMENT Nephrolithiasis  ***We reviewed recent imaging results; ***awaiting radiology results, appears to have ***no acute findings per provider interpretation.  ***For stone prevention: Advised adequate hydration and we discussed option to consider low oxalate diet given that calcium oxalate is the most common type of stone. Handout provided about stone prevention diet.  ***For recurrent stone formers: We discussed option to proceed  with 24 hour urinalysis ([facility]) for metabolic stone evaluation, which may help with targeted recommendations for dietary I medication therapies for stone prevention. Patient elected to ***proceed/ ***hold off.  Will plan to follow up in ***6 months / ***1 year with ***KUB ***RUS for stone surveillance or sooner if needed.  Patient verbalized understanding of and agreement with current plan. All questions were  answered.  PLAN Advised the following: Maintain adequate fluid intake daily. Drink citrus juice (lemon, lime or orange juice) routinely. Low oxalate diet. No follow-ups on file.  No orders of the defined types were placed in this encounter.   It has been explained that the patient is to follow regularly with their PCP in addition to all other providers involved in their care and to follow instructions provided by these respective offices. Patient advised to contact urology clinic if any urologic-pertaining questions, concerns, new symptoms or problems arise in the interim period.  There are no Patient Instructions on file for this visit.  Electronically signed by:  Donnita Falls, MSN, FNP-C, CUNP 09/05/2023 5:16 PM

## 2023-09-07 ENCOUNTER — Ambulatory Visit: Admitting: Urology

## 2023-09-07 DIAGNOSIS — N200 Calculus of kidney: Secondary | ICD-10-CM

## 2023-10-23 NOTE — Progress Notes (Deleted)
 Name: Lindsay Andrade DOB: 04-03-1986 MRN: [id number]  History of Present Illness: [name] is a 38 y.o. female who presents today for follow up visit at [facility]. ***She is accompanied by ***. GU History includes: 1. Kidney stones. - 03/23/2023: Stone analysis showed 90% calcium oxalate and 10% hydroxyapatite composition. - 04/25/2023: Underwent right ESWL procedure by Dr. Claretta Croft.  At last visit on 05/09/2023: - KUB: "Previous right renal stone is not seen on the current exam. No visualized urolithiasis."  Since last visit: > 06/10/2023: 24 hr urine test showed mildly elevated urine pH and hypocitraturia.   Today: She {Actions; denies-reports:120008} recent stone passage. She {Actions; denies-reports:120008} flank pain or abdominal pain. She {Actions; denies-reports:120008} fevers, nausea, or vomiting.  She {Actions; denies-reports:120008} increased urinary urgency, frequency, nocturia, dysuria, gross hematuria, hesitancy, straining to void, or sensations of incomplete emptying.  ***repeat KUB in 3-6 months (today is mostly to discuss 24 hr urine results, which showed mildly elevated urine pH (too basic) and hypocitraturia (not enough citrate in urine).  - Based on findings our goal is to increase urine acidity.  - Options: 1. Dietary changes including increase citrus fruit intake, decrease animal protein, and decrease sodium intake.  2. Medication: Potassium citrate 2x/day. ***rx?    Medications: Current Outpatient Medications  Medication Sig Dispense Refill   buPROPion (WELLBUTRIN XL) 300 MG 24 hr tablet Take 300 mg by mouth daily.     ibuprofen (ADVIL) 200 MG tablet Take 800 mg by mouth every 6 (six) hours as needed for headache or moderate pain (pain score 4-6).     ketorolac  (TORADOL ) 10 MG tablet Take 1 tablet (10 mg total) by mouth every 8 (eight) hours as needed. 30 tablet 0   ondansetron  (ZOFRAN ) 4 MG tablet Take 1 tablet (4 mg total) by mouth  every 8 (eight) hours as needed for nausea or vomiting. 30 tablet 0   ondansetron  (ZOFRAN ) 4 MG tablet Take 1 tablet (4 mg total) by mouth daily as needed for nausea or vomiting. 30 tablet 1   oxyCODONE -acetaminophen  (PERCOCET) 5-325 MG tablet Take 1 tablet by mouth every 4 (four) hours as needed. 30 tablet 0   oxymetazoline (AFRIN) 0.05 % nasal spray Place 1 spray into both nostrils 2 (two) times daily as needed for congestion.     pantoprazole (PROTONIX) 40 MG tablet Take 40 mg by mouth daily.     sertraline (ZOLOFT) 25 MG tablet Take 25 mg by mouth daily.     tamsulosin  (FLOMAX ) 0.4 MG CAPS capsule Take 1 capsule (0.4 mg total) by mouth at bedtime. 30 capsule 0   Current Facility-Administered Medications  Medication Dose Route Frequency Provider Last Rate Last Admin   ciprofloxacin  (CIPRO ) tablet 500 mg  500 mg Oral Once McKenzie, Arden Beck, MD        Allergies: No Known Allergies  Past Medical History:  Diagnosis Date   Anxiety    Asthma    hx of    Headache    History of kidney stones    Past Surgical History:  Procedure Laterality Date   CHOLECYSTECTOMY N/A 04/10/2020   Procedure: LAPAROSCOPIC CHOLECYSTECTOMY WITH INTRAOPERATIVE CHOLANGIOGRAM;  Surgeon: Jacolyn Matar, MD;  Location: [facility];  Service: General;  Laterality: N/A;   CYSTOSCOPY WITH RETROGRADE PYELOGRAM, URETEROSCOPY AND STENT PLACEMENT Right 02/23/2023   Procedure: CYSTOSCOPY WITH RETROGRADE PYELOGRAM, DIAGNOSTIC URETEROSCOPY AND STENT PLACEMENT;  Surgeon: Marco Severs, MD;  Location: [facility];  Service: Urology;  Laterality: Right;  pt can't  come earlier, has to put daughter on school bus   EXTRACORPOREAL SHOCK WAVE LITHOTRIPSY Right 12/06/2022   Procedure: EXTRACORPOREAL SHOCK WAVE LITHOTRIPSY (ESWL);  Surgeon: Marco Severs, MD;  Location: [facility];  Service: Urology;  Laterality: Right;   EXTRACORPOREAL SHOCK WAVE LITHOTRIPSY Right 03/07/2023   Procedure: EXTRACORPOREAL SHOCK WAVE LITHOTRIPSY (ESWL);   Surgeon: Marco Severs, MD;  Location: [facility];  Service: Urology;  Laterality: Right;   EXTRACORPOREAL SHOCK WAVE LITHOTRIPSY Right 04/25/2023   Procedure: EXTRACORPOREAL SHOCK WAVE LITHOTRIPSY (ESWL);  Surgeon: Marco Severs, MD;  Location: [facility];  Service: Urology;  Laterality: Right;   No family history on file. Social History   Socioeconomic History   Marital status: Married    Spouse name: Not on file   Number of children: 2   Years of education: 12   Highest education level: Not on file  Occupational History   Not on file  Tobacco Use   Smoking status: Former   Smokeless tobacco: Never  Vaping Use   Vaping status: Former   Substances: Nicotine  Substance and Sexual Activity   Alcohol use: Never   Drug use: Never   Sexual activity: Yes  Other Topics Concern   Not on file  Social History Narrative   Not on file   Social Drivers of Health   Financial Resource Strain: Not on file  Food Insecurity: Not on file  Transportation Needs: Not on file  Physical Activity: Not on file  Stress: Not on file  Social Connections: Unknown (02/16/2023)   Received from [facility]   Social Network    Social Network: Not on file  Intimate Partner Violence: Unknown (02/16/2023)   Received from [facility]   HITS    Physically Hurt: Not on file    Insult or Talk Down To: Not on file    Threaten Physical Harm: Not on file    Scream or Curse: Not on file    SUBJECTIVE  Review of Systems Constitutional: Patient denies any unintentional weight loss or change in strength lntegumentary: Patient denies any rashes or pruritus Cardiovascular: Patient denies chest pain or syncope Respiratory: Patient denies shortness of breath Gastrointestinal: ***Patient {Actions; denies-reports:120008} ***nausea, ***vomiting, ***constipation, ***diarrhea ***As per HPI Musculoskeletal: Patient denies muscle cramps or weakness Neurologic: Patient denies convulsions or  seizures Allergic/Immunologic: Patient denies recent allergic reaction(s) Hematologic/Lymphatic: Patient denies bleeding tendencies Endocrine: Patient denies heat/cold intolerance  GU: As per HPI.  OBJECTIVE There were no vitals filed for this visit. There is no height or weight on file to calculate BMI.  Physical Examination Constitutional: No obvious distress; patient is non-toxic appearing  Cardiovascular: No visible lower extremity edema.  Respiratory: The patient does not have audible wheezing/stridor; respirations do not appear labored  Gastrointestinal: Abdomen non-distended Musculoskeletal: Normal ROM of UEs  Skin: No obvious rashes/open sores  Neurologic: CN 2-12 grossly intact Psychiatric: Answered questions appropriately with normal affect  Hematologic/Lymphatic/Immunologic: No obvious bruises or sites of spontaneous bleeding  UA: ***negative ***positive for *** leukocytes, *** blood, ***nitrites Urine microscopy: *** WBC/hpf, *** RBC/hpf, *** bacteria ***otherwise unremarkable ***glucosuria (secondary to ***Jardiance ***Farxiga use)  PVR: *** ml  ASSESSMENT Nephrolithiasis  ***We reviewed recent imaging results; ***awaiting radiology results, appears to have ***no acute findings per provider interpretation.  ***For stone prevention: Advised adequate hydration and we discussed option to consider low oxalate diet given that calcium oxalate is the most common type of stone. Handout provided about stone prevention diet.  ***For recurrent stone formers:  We discussed option to proceed with 24 hour urinalysis ([facility]) for metabolic stone evaluation, which may help with targeted recommendations for dietary I medication therapies for stone prevention. Patient elected to ***proceed/ ***hold off.  Will plan to follow up in ***6 months / ***1 year with ***KUB ***[facility] for stone surveillance or sooner if needed.  Patient verbalized understanding of and agreement with current  plan. All questions were answered.  PLAN Advised the following: Maintain adequate fluid intake daily. Drink citrus juice (lemon, lime or orange juice) routinely. Low oxalate diet. No follow-ups on file.  No orders of the defined types were placed in this encounter.   It has been explained that the patient is to follow regularly with their PCP in addition to all other providers involved in their care and to follow instructions provided by these respective offices. Patient advised to contact urology clinic if any urologic-pertaining questions, concerns, new symptoms or problems arise in the interim period.  There are no Patient Instructions on file for this visit.  Electronically signed by:  Lauretta Ponto, MSN, FNP-C, CUNP 10/23/2023 3:27 PM

## 2023-10-24 ENCOUNTER — Ambulatory Visit: Admitting: Urology
